# Patient Record
Sex: Male | Born: 1965 | Race: Black or African American | Hispanic: No | Marital: Married | Smoking: Never smoker
Health system: Southern US, Community
[De-identification: ages and names within clinical notes are randomized; demographics above are authoritative.]

## PROBLEM LIST (undated history)

## (undated) DIAGNOSIS — E039 Hypothyroidism, unspecified: Secondary | ICD-10-CM

## (undated) DIAGNOSIS — K259 Gastric ulcer, unspecified as acute or chronic, without hemorrhage or perforation: Secondary | ICD-10-CM

## (undated) HISTORY — PX: SHOULDER SURGERY: SHX246

## (undated) HISTORY — PX: KNEE SURGERY: SHX244

## (undated) HISTORY — PX: BACK SURGERY: SHX140

---

## 2008-08-19 ENCOUNTER — Emergency Department (HOSPITAL_COMMUNITY): Admission: EM | Admit: 2008-08-19 | Discharge: 2008-08-20 | Payer: Self-pay | Admitting: Emergency Medicine

## 2008-10-25 ENCOUNTER — Encounter (HOSPITAL_COMMUNITY): Admission: RE | Admit: 2008-10-25 | Discharge: 2008-11-24 | Payer: Self-pay | Admitting: Family Medicine

## 2010-03-25 ENCOUNTER — Emergency Department: Payer: Self-pay | Admitting: Emergency Medicine

## 2010-05-05 ENCOUNTER — Emergency Department (HOSPITAL_COMMUNITY): Admission: EM | Admit: 2010-05-05 | Discharge: 2010-05-05 | Payer: Self-pay | Admitting: Emergency Medicine

## 2011-05-13 ENCOUNTER — Other Ambulatory Visit: Payer: Self-pay

## 2011-05-13 ENCOUNTER — Emergency Department (HOSPITAL_COMMUNITY)
Admission: EM | Admit: 2011-05-13 | Discharge: 2011-05-13 | Disposition: A | Discharge status: Home / Self Care | Payer: Non-veteran care | Attending: Emergency Medicine | Admitting: Emergency Medicine

## 2011-05-13 ENCOUNTER — Emergency Department (HOSPITAL_COMMUNITY): Payer: Non-veteran care

## 2011-05-13 ENCOUNTER — Encounter: Payer: Self-pay | Admitting: Emergency Medicine

## 2011-05-13 DIAGNOSIS — R079 Chest pain, unspecified: Secondary | ICD-10-CM | POA: Insufficient documentation

## 2011-05-13 DIAGNOSIS — M7120 Synovial cyst of popliteal space [Baker], unspecified knee: Secondary | ICD-10-CM | POA: Insufficient documentation

## 2011-05-13 DIAGNOSIS — Z7982 Long term (current) use of aspirin: Secondary | ICD-10-CM | POA: Insufficient documentation

## 2011-05-13 HISTORY — DX: Hypothyroidism, unspecified: E03.9

## 2011-05-13 LAB — BASIC METABOLIC PANEL
CO2: 20 mEq/L (ref 19–32)
Calcium: 10.5 mg/dL (ref 8.4–10.5)
Chloride: 97 mEq/L (ref 96–112)
GFR calc Af Amer: >60 mL/min (ref >60)
GFR calc non Af Amer: >60 mL/min (ref >60)
Glucose, Bld: 116 mg/dL — ABNORMAL HIGH (ref 70–99)
Potassium: 3.9 mEq/L (ref 3.5–5.1)

## 2011-05-13 LAB — CBC
HCT: 34.1 % — ABNORMAL LOW (ref 39.0–52.0)
MCH: 27.5 pg (ref 26.0–34.0)
MCV: 86.1 fL (ref 78.0–100.0)
Platelets: 523 10*3/uL — ABNORMAL HIGH (ref 150–400)
WBC: 7.3 10*3/uL (ref 4.0–10.5)

## 2011-05-13 LAB — POCT I-STAT TROPONIN I: Troponin i, poc: 0 ng/mL (ref 0.00–0.08)

## 2011-05-13 MED ORDER — HYDROMORPHONE HCL 1 MG/ML IJ SOLN
0.5000 mg | Freq: Once | INTRAMUSCULAR | Status: AC
  Administered 2011-05-13: 1 mg via INTRAVENOUS
  Filled 2011-05-13: qty 1

## 2011-05-13 MED ORDER — DIAZEPAM 5 MG PO TABS: 5.0000 mg | ORAL_TABLET | Freq: Two times a day (BID) | ORAL | Status: AC

## 2011-05-13 MED ORDER — HYDROMORPHONE HCL 1 MG/ML IJ SOLN
1.0000 mg | Freq: Once | INTRAMUSCULAR | Status: AC
  Administered 2011-05-13: 1 mg via INTRAVENOUS
  Filled 2011-05-13: qty 1

## 2011-05-13 MED ORDER — ONDANSETRON HCL 4 MG/2ML IJ SOLN
4.0000 mg | Freq: Once | INTRAMUSCULAR | Status: AC
  Administered 2011-05-13: 4 mg via INTRAVENOUS
  Filled 2011-05-13: qty 2

## 2011-05-13 MED ORDER — HYDROCODONE-ACETAMINOPHEN 5-325 MG PO TABS: 1.0000 | ORAL_TABLET | ORAL | Status: AC | PRN

## 2011-05-13 NOTE — ED Provider Notes (Signed)
History   Chart scribed for Joya Gaskins, MD by Enos Fling; the patient was seen in room APA18/APA18; this patient's care was started at 9:20 AM.    CSN: [id number] Arrival date & time: 05/13/2011  9:20 AM  Chief Complaint  Patient presents with  . Chest Pain  . Shortness of Breath   HPI Louis Ramos is a 45 y.o. male who presents to the Emergency Department complaining of chest pain. Pt reports sharp, constant left sided chest pain was sudden in onset while at rest approx 10 hours ago. Pain is associated with sob and diaphoresis, is non-radiating, and worse with movement and with deep breathing. Reports 1 episode of vomiting en route to ED. Denies fever, chills, cough, congestion, dizziness, weakness, or ha. Pt reports similar pain in the past caused by already being in pain, thinks this cp caused by his left knee pain. Pt is 3 weeks s/p knee surgery and has been sitting a lot d/t this. No recent travel.    PAST MEDICAL HISTORY:  Past Medical History  Diagnosis Date  . Hypothyroid     PAST SURGICAL HISTORY:  Past Surgical History  Procedure Date  . Knee surgery   . Shoulder surgery     MEDICATIONS:  Previous Medications   ACETAMINOPHEN (TYLENOL) 325 MG TABLET    Take 650 mg by mouth every 6 (six) hours as needed. Pain    ASPIRIN 325 MG TABLET    Take 325 mg by mouth daily.     HYDROCODONE-ACETAMINOPHEN (NORCO) 10-325 MG PER TABLET    Take 1 tablet by mouth every 4 (four) hours as needed. Pain    METHIMAZOLE (TAPAZOLE) 10 MG TABLET    Take 20 mg by mouth daily.     METHOCARBAMOL (ROBAXIN) 750 MG TABLET    Take 750 mg by mouth daily.     MULTIPLE VITAMINS-MINERALS (MULTIVITAMIN WITH MINERALS) TABLET    Take 1 tablet by mouth daily.     OXYCODONE (OXY-IR) 5 MG CAPSULE    Take 5-10 mg by mouth every 4 (four) hours as needed. Knee Pain    SENNA (SENOKOT) 8.6 MG TABLET    Take 1 tablet by mouth daily as needed. Constipation      ALLERGIES:  Allergies as of 05/13/2011    . (No Known Allergies)     FAMILY HISTORY:  History reviewed. No pertinent family history.   SOCIAL HISTORY: History   Social History  . Marital Status: Married    Spouse Name: N/A    Number of Children: N/A  . Years of Education: N/A   Social History Main Topics  . Smoking status: Never Smoker   . Smokeless tobacco: None  . Alcohol Use: Yes  . Drug Use: No  . Sexually Active:    Other Topics Concern  . None   Social History Narrative  . None       Review of Systems 10 Systems reviewed and are negative for acute change except as noted in the HPI.  Physical Exam  BP 129/79  Pulse 84  Temp(Src) 98.7 F (37.1 C) (Oral)  Resp 20  Ht 6' (1.829 m)  Wt 244 lb (110.678 kg)  BMI 33.09 kg/m2  SpO2 99%  Physical Exam CONSTITUTIONAL: Well developed/well nourished HEAD AND FACE: Normocephalic/atraumatic EYES: EOMI/PERRL ENMT: Mucous membranes moist NECK: supple no meningeal signs SPINE:entire spine nontender CV: S1/S2 noted, no murmurs/rubs/gallops noted; CHEST: nontender LUNGS: Lungs are clear to auscultation bilaterally, no apparent distress ABDOMEN: soft, nontender,  no rebound or guarding GU:no cva tenderness NEURO: Pt is awake/alert, moves all extremitiesx4 EXTREMITIES: pulses normal; well healing scar to left anterior knee; pain with ROM left knee; no calf edema, tenderness, or erythema SKIN: warm, color normal PSYCH: no abnormalities of mood noted  ED Course  Procedures  OTHER DATA REVIEWED: Nursing notes, vital signs, and past medical records reviewed.   DIAGNOSTIC STUDIES:   Date: 05/13/2011  Rate: 92  Rhythm: normal sinus rhythm  QRS Axis: normal  Intervals: PR prolonged  ST/T Wave abnormalities: nonspecific ST changes  Conduction Disutrbances:none  Narrative Interpretation:   Old EKG Reviewed: none available   LABS / RADIOLOGY:  All labs/vitals reviewed and considered xrays reviewed and considered Initial cxr unremarkable, no acute  findings by EKG    ED COURSE / COORDINATION OF CARE: 12:01 PM Pt improved.  I had long d/w patient and his wife.  Apparently he has been having persistent pain in left knee/thigh/calf since surgery. He will have "spasms" of pain in the limb, and will cause intense pain and cause him to have cold sweats.  He reports last night, he was having spasms of pain in his left LE and he felt very anxious and felt left sided CP that was sharp and worse with palpation/movement of left Ue.  My suspicion for ACS is low (troponin ordered by nurse) and given history/exam, I doubt a PE.  However, he does have persistent pain in left thigh and some in left calf on repeat eval.  Will order DVT study to r/o DVT.  Pt reports no known h/o DVT and no eval for DVT.  His wound from recent surgery looks well healed without signs of infection   D/w patient about pain control.  He sees no relief from spasms from methacarbamol.  Advise to hold this and will start short course of valium for spasms He also is requesting hydrocodone as most of this was used after his surgery.  I will write short course of vicodin and [facility]  IMPRESSION Baker's cyst  Chest pain, unspecified      PLAN:  discharge The patient is to return the emergency department if there is any worsening of symptoms. I have reviewed the discharge instructions with the patient   CONDITION ON DISCHARGE: stable   MEDICATIONS GIVEN IN THE E.D. HYDROmorphone (DILAUDID) injection 1 mg (1 mg Intravenous Given 05/13/11 0939)  ondansetron (ZOFRAN) injection 4 mg (4 mg Intravenous Given 05/13/11 0938)  HYDROmorphone (DILAUDID) injection 0.5 mg (1 mg Intravenous Given 05/13/11 1035)     DISCHARGE MEDICATIONS: New Prescriptions   DIAZEPAM (VALIUM) 5 MG TABLET    Take 1 tablet (5 mg total) by mouth 2 (two) times daily.   HYDROCODONE-ACETAMINOPHEN (NORCO) 5-325 MG PER TABLET    Take 1 tablet by mouth every 4 (four) hours as needed for  pain (do not take with tylenol).     I personally performed the services described in this documentation, which was scribed in my presence. The recorded information has been reviewed and considered. Joya Gaskins, MD      Joya Gaskins, MD 05/13/11 [phone]

## 2011-05-13 NOTE — ED Notes (Signed)
Chest pain with sob since last night. N/v this am.

## 2011-05-13 NOTE — ED Notes (Signed)
Has no old EKG

## 2011-05-13 NOTE — ED Notes (Signed)
Left in c/o spouse for transport home; a&ox4; in no distress; VSS; verbalizes understanding of instructions given

## 2011-07-08 LAB — CLOSTRIDIUM DIFFICILE EIA

## 2011-07-08 LAB — FECAL LACTOFERRIN, QUANT: Fecal Lactoferrin: POSITIVE

## 2011-07-08 LAB — BASIC METABOLIC PANEL
BUN: 19
Creatinine, Ser: 0.72
GFR calc non Af Amer: >60

## 2011-07-08 LAB — OVA AND PARASITE EXAMINATION: Ova and parasites: NONE SEEN

## 2011-07-08 LAB — STOOL CULTURE

## 2011-07-08 LAB — DIFFERENTIAL
Basophils Absolute: 0
Eosinophils Absolute: 0.3
Lymphocytes Relative: 37
Lymphs Abs: 2.9
Neutrophils Relative %: 47

## 2011-07-08 LAB — CBC
Platelets: 222
RDW: 12.5
WBC: 7.8

## 2012-06-04 ENCOUNTER — Emergency Department (HOSPITAL_COMMUNITY)
Admission: EM | Admit: 2012-06-04 | Discharge: 2012-06-05 | Disposition: A | Discharge status: Home / Self Care | Payer: Non-veteran care | Attending: Emergency Medicine | Admitting: Emergency Medicine

## 2012-06-04 ENCOUNTER — Encounter (HOSPITAL_COMMUNITY): Payer: Self-pay | Admitting: Emergency Medicine

## 2012-06-04 DIAGNOSIS — M549 Dorsalgia, unspecified: Secondary | ICD-10-CM | POA: Insufficient documentation

## 2012-06-04 DIAGNOSIS — E039 Hypothyroidism, unspecified: Secondary | ICD-10-CM | POA: Insufficient documentation

## 2012-06-04 DIAGNOSIS — M1710 Unilateral primary osteoarthritis, unspecified knee: Secondary | ICD-10-CM | POA: Insufficient documentation

## 2012-06-04 DIAGNOSIS — M1711 Unilateral primary osteoarthritis, right knee: Secondary | ICD-10-CM

## 2012-06-04 HISTORY — DX: Gastric ulcer, unspecified as acute or chronic, without hemorrhage or perforation: K25.9

## 2012-06-04 MED ORDER — OXYCODONE-ACETAMINOPHEN 5-325 MG PO TABS
2.0000 | ORAL_TABLET | Freq: Once | ORAL | Status: AC
  Administered 2012-06-05: 2 via ORAL
  Filled 2012-06-04: qty 2

## 2012-06-04 NOTE — ED Notes (Signed)
Patient complaining of lower back pain and bilateral knee pain for "years." States "we took our son shopping last week and I guess it aggravated it."

## 2012-06-04 NOTE — ED Provider Notes (Signed)
History     CSN: [id number]  Arrival date & time 06/04/12  2252   First MD Initiated Contact with Patient 06/04/12 2344      Chief Complaint  Patient presents with  . Knee Pain  . 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 He states that he has had pain in his left knee ever since the surgery, has been seen by his orthopedist at the [facility] and told that there was nothing they could do about it. It has not worsened but is persistent. His right knee has been hurting over the last week since he was walking more when he was shopping with his family. The right knee hurts on the medial surface, is worse with standing, relieved with sitting. There is no significant swelling, fever, rash but he does complain of frequent urination. He denies any IV drug use, cancer and he states that the pain in his lower back has been on for years though it has been worse lately. It is totally relieved when he lays down and worse when he stands up. He does not have any numbness weakness or difficulty using his legs other than the pain in his knees. He has used a variety of medications including methadone, Vicodin and Tylenol and aspirin, minimal improvement.  Patient is a 46 y.o. male presenting with knee pain and back pain. The history is provided by the patient and the spouse.  Knee Pain  Back Pain     Past Medical History  Diagnosis Date  . Hypothyroid   . Stomach ulcer     Past Surgical History  Procedure Date  . Knee surgery   . Shoulder surgery     History reviewed. No pertinent family history.  History  Substance Use Topics  . Smoking status: Never Smoker   . Smokeless tobacco: Not on file  . Alcohol Use: No      Review of Systems  Musculoskeletal: Positive for  back pain.  All other systems reviewed and are negative.    Allergies  Review of patient's allergies indicates no known allergies.  Home Medications   Current Outpatient Rx  Name Route Sig Dispense Refill  . ACETAMINOPHEN 325 MG PO TABS Oral Take 650 mg by mouth every 6 (six) hours as needed. Pain     . ASPIRIN 325 MG PO TABS Oral Take 325 mg by mouth daily.      Marland Kitchen HYDROCODONE-ACETAMINOPHEN 10-325 MG PO TABS Oral Take 1 tablet by mouth every 4 (four) hours as needed. Pain     . METHIMAZOLE 10 MG PO TABS Oral Take 20 mg by mouth daily.      Marland Kitchen METHOCARBAMOL 750 MG PO TABS Oral Take 750 mg by mouth daily.      . MULTI-VITAMIN/MINERALS PO TABS Oral Take 1 tablet by mouth daily.      . OXYCODONE HCL 5 MG PO CAPS Oral Take 5-10 mg by mouth every 4 (four) hours as needed. Knee Pain     . OXYCODONE-ACETAMINOPHEN 5-325 MG PO TABS Oral Take 1 tablet by mouth every 4 (four) hours as needed for pain. 20 tablet 0  . SENNOSIDES 8.6 MG PO TABS Oral Take 1 tablet by mouth daily as needed. Constipation       BP 130/94  Pulse 76  Temp 97.9 F (36.6 C) (Oral)  Resp 18  Ht 6' (1.829 m)  Wt 290 lb (131.543 kg)  BMI 39.33 kg/m2  SpO2 95%  Physical Exam  Nursing note and vitals reviewed. Constitutional: He appears well-developed and well-nourished. No distress.  HENT:  Head: Normocephalic and atraumatic.  Mouth/Throat: Oropharynx is clear and moist. No oropharyngeal exudate.  Eyes: Conjunctivae and EOM are normal. Pupils are equal, round, and reactive to light. Right eye exhibits no discharge. Left eye exhibits no discharge. No scleral icterus.  Neck: Normal range of motion. Neck supple. No JVD present. No thyromegaly present.  Cardiovascular: Normal rate, regular rhythm, normal heart sounds and intact distal pulses.  Exam reveals no gallop and no friction rub.   No murmur heard. Pulmonary/Chest: Effort normal and breath sounds normal. No respiratory distress. He has no wheezes. He has no rales.    Abdominal: Soft. Bowel sounds are normal. He exhibits no distension and no mass. There is no tenderness.  Musculoskeletal: Normal range of motion. He exhibits tenderness ( Focal tenderness to palpation over the lower lumbar spine and paraspinal muscles, no rashes or injuries to the skin). He exhibits no edema.       Tenderness over the bilateral knees, the left knee is tender at the left lateral lower knee, the right is tender at the right medial lower knee. There is decreased range of motion bilaterally secondary to this pain.  Lymphadenopathy:    He has no cervical adenopathy.  Neurological: He is alert. Coordination normal.       Antalgic gait secondary to pain in the knees and back, when isolated normal strength of the quadriceps, hamstrings, calf muscles, upper tremor is appear normal. Sensation intact to the lower extremities bilaterally  Skin: Skin is warm and dry. No rash noted. No erythema.  Psychiatric: He has a normal mood and affect. His behavior is normal.    ED Course  Procedures (including critical care time)  Labs Reviewed  URINALYSIS, ROUTINE W REFLEX MICROSCOPIC - Abnormal; Notable for the following:    Specific Gravity, Urine >1.030 (*)     Hgb urine dipstick TRACE (*)     All other components within normal limits  URINE MICROSCOPIC-ADD ON - Abnormal; Notable for the following:    Squamous Epithelial / LPF FEW (*)     All other components within normal limits  GLUCOSE, CAPILLARY   Dg Knee Complete 4 Views Left  06/05/2012  *RADIOLOGY REPORT*  Clinical Data: Knee pain with history of left knee arthroplasty.  LEFT KNEE - COMPLETE 4+ VIEW  Comparison: None.  Findings: Left total knee arthroplasty shows normal alignment.  No evidence of fracture or dislocation.  No joint effusion identified. No abnormal lucency surrounding the knee prosthesis.  IMPRESSION: Normal appearance of left knee following prior arthroplasty.   Original Report Authenticated By: Reola Calkins, M.D.     Dg Knee Complete 4 Views Right  06/05/2012  *RADIOLOGY REPORT*  Clinical Data: Right knee pain.  RIGHT KNEE - COMPLETE 4+ VIEW  Comparison: None.  Findings: There is evidence of osteoarthritis with tricompartmental degenerative changes present.  The most significant joint space narrowing involves the medial compartment.  No evidence of fracture, dislocation or bony lesion.  No significant joint fluid identified.  IMPRESSION: Tricompartmental osteoarthritis.   Original Report Authenticated By: Reola Calkins, M.D.      1. Osteoarthritis of right knee   2. Back pain       MDM  Imaging of the lower back will yield minimal results, the patient is insistent  on having his knees x-rayed, vital signs are unremarkable, there is no neurologic symptoms or pathologic signs of back pain. Check CBG and urinalysis to rule out hyperglycemia or urinary infection as source of the patient's urinary frequency. Percocet ordered   I have personally interpreted the x-rays and find her to be osteoarthritis of the right knee present. There are no signs of fractures of the right knee, the internal hardware in the left knee appears normal. The patient has had improvement with Percocet, his urinalysis has been reviewed showing no signs of infection and his blood sugar is normal. Reassurance given, orthopedic followup encouraged, with Percocet. The patient does have a significant history of stomach ulcers after taking anti-inflammatories in the past.     Vida Roller, MD 06/05/12 [phone]

## 2012-06-05 ENCOUNTER — Emergency Department (HOSPITAL_COMMUNITY): Payer: Non-veteran care

## 2012-06-05 LAB — URINALYSIS, ROUTINE W REFLEX MICROSCOPIC
Glucose, UA: NEGATIVE mg/dL
Leukocytes, UA: NEGATIVE
Nitrite: NEGATIVE
Protein, ur: NEGATIVE mg/dL

## 2012-06-05 LAB — URINE MICROSCOPIC-ADD ON

## 2012-06-05 LAB — GLUCOSE, CAPILLARY: Glucose-Capillary: 99 mg/dL (ref 70–99)

## 2012-06-05 MED ORDER — OXYCODONE-ACETAMINOPHEN 5-325 MG PO TABS: 1.0000 | ORAL_TABLET | ORAL | Status: AC | PRN

## 2013-01-01 IMAGING — CR DG KNEE COMPLETE 4+V*L*
4 series · 4 of 4 positions shown · non-contrast
Comparison: None.

CLINICAL DATA: Knee pain with history of left knee arthroplasty.

LEFT KNEE - COMPLETE 4+ VIEW

[view not recorded (1 of 4)]
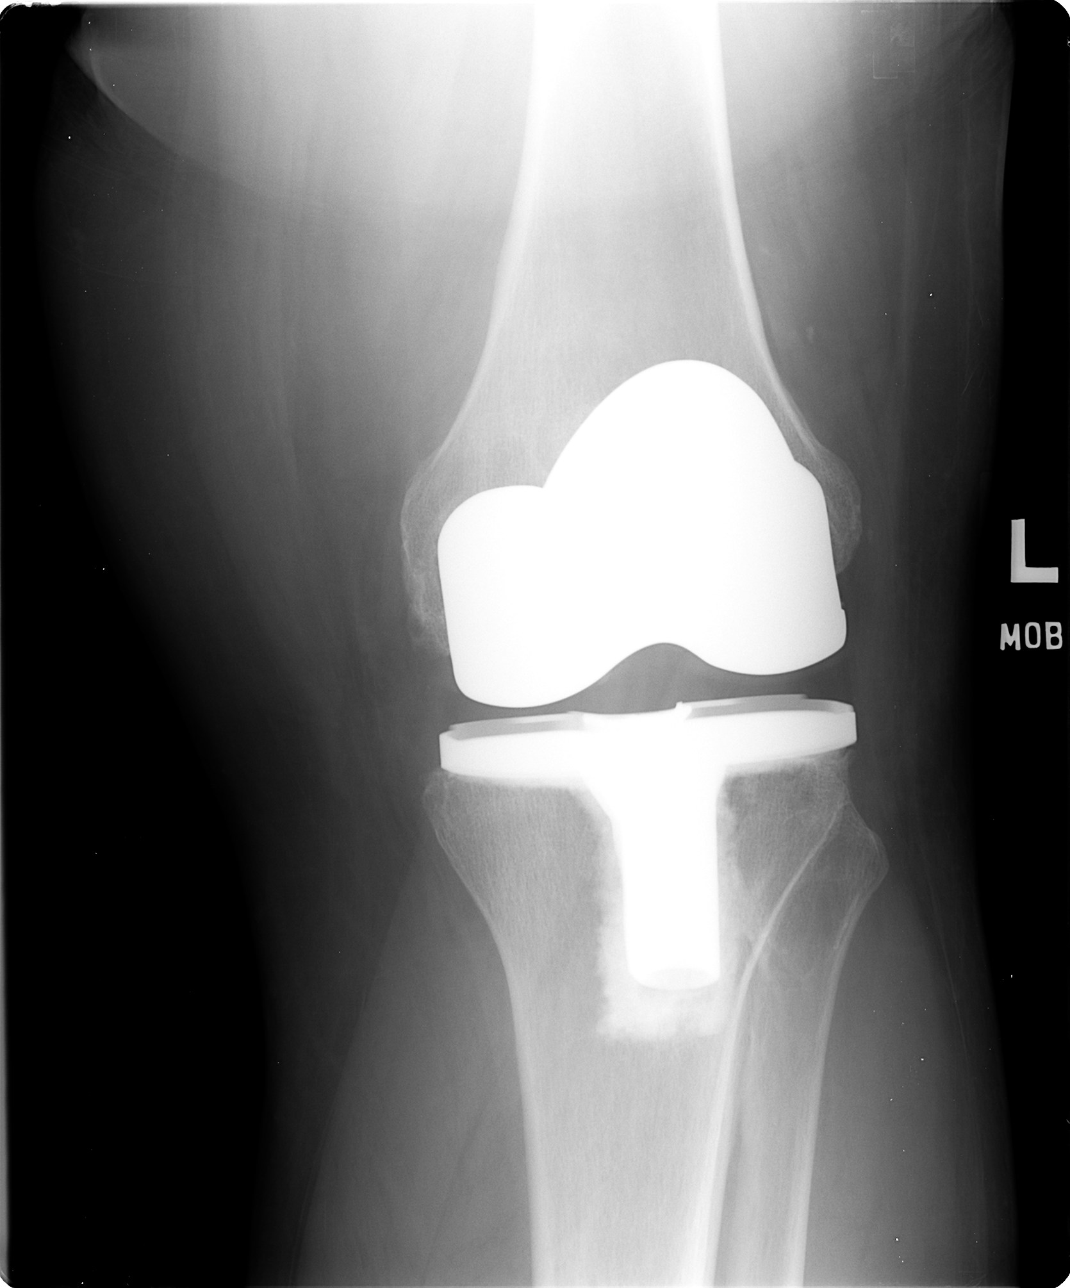

[view not recorded (2 of 4)]
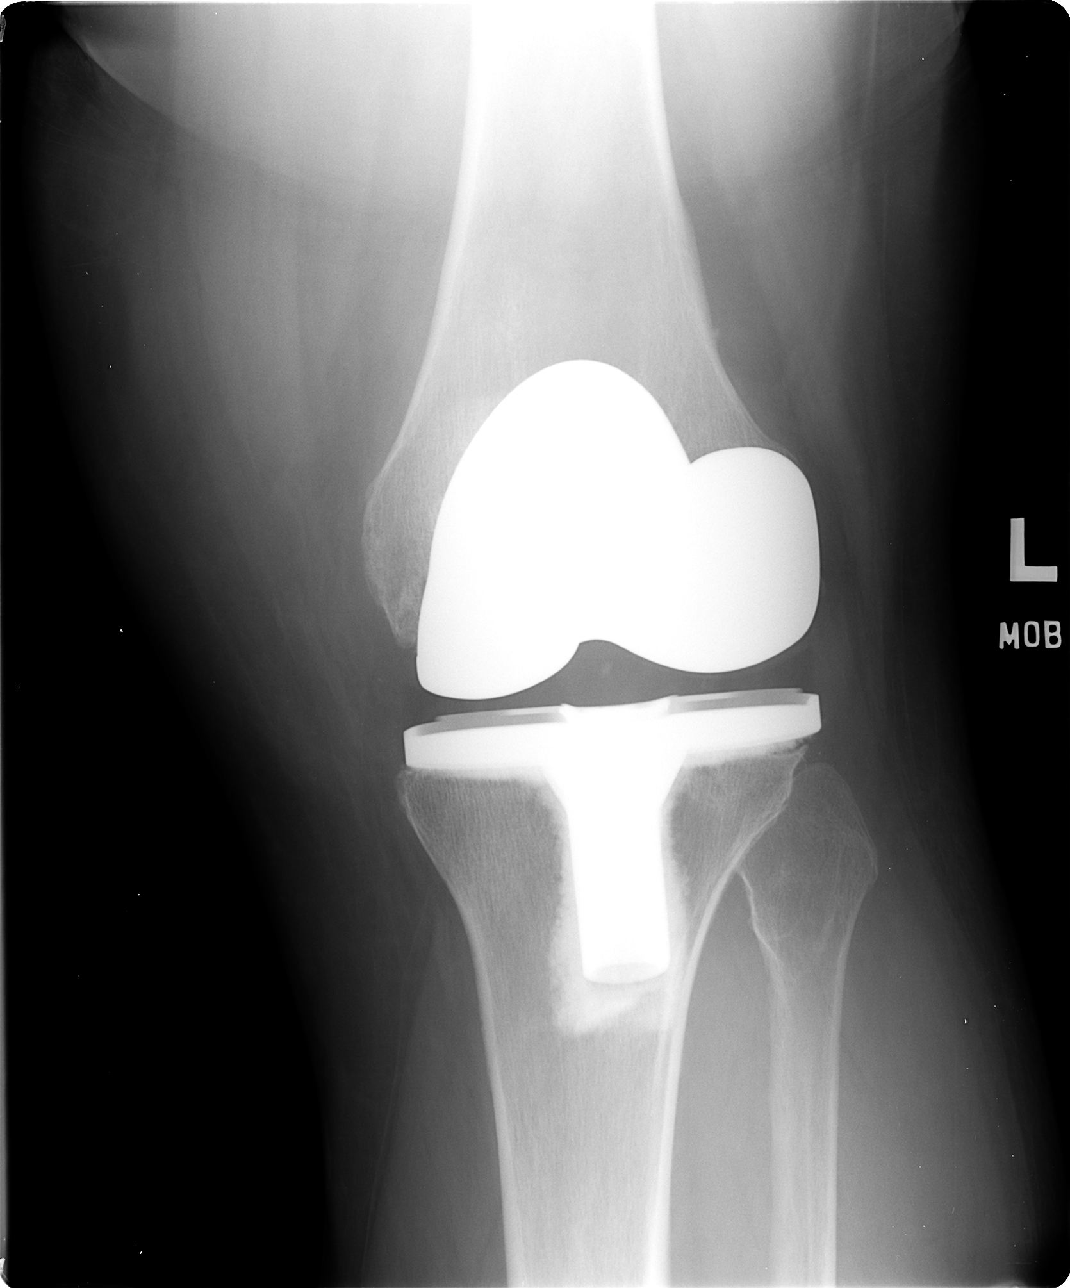

[view not recorded (3 of 4)]
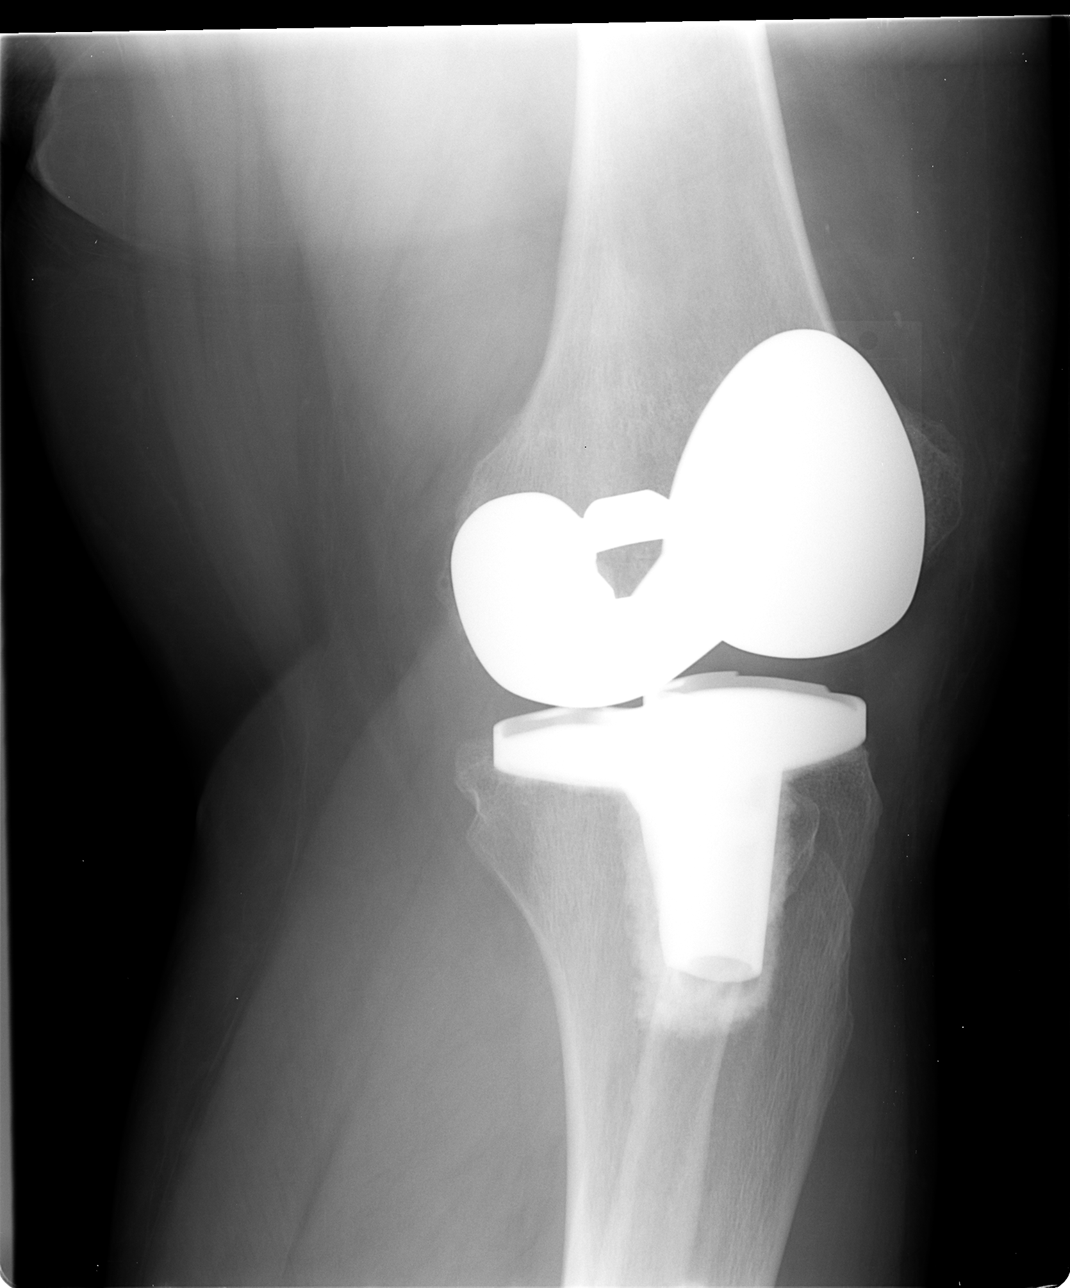

[view not recorded (4 of 4)]
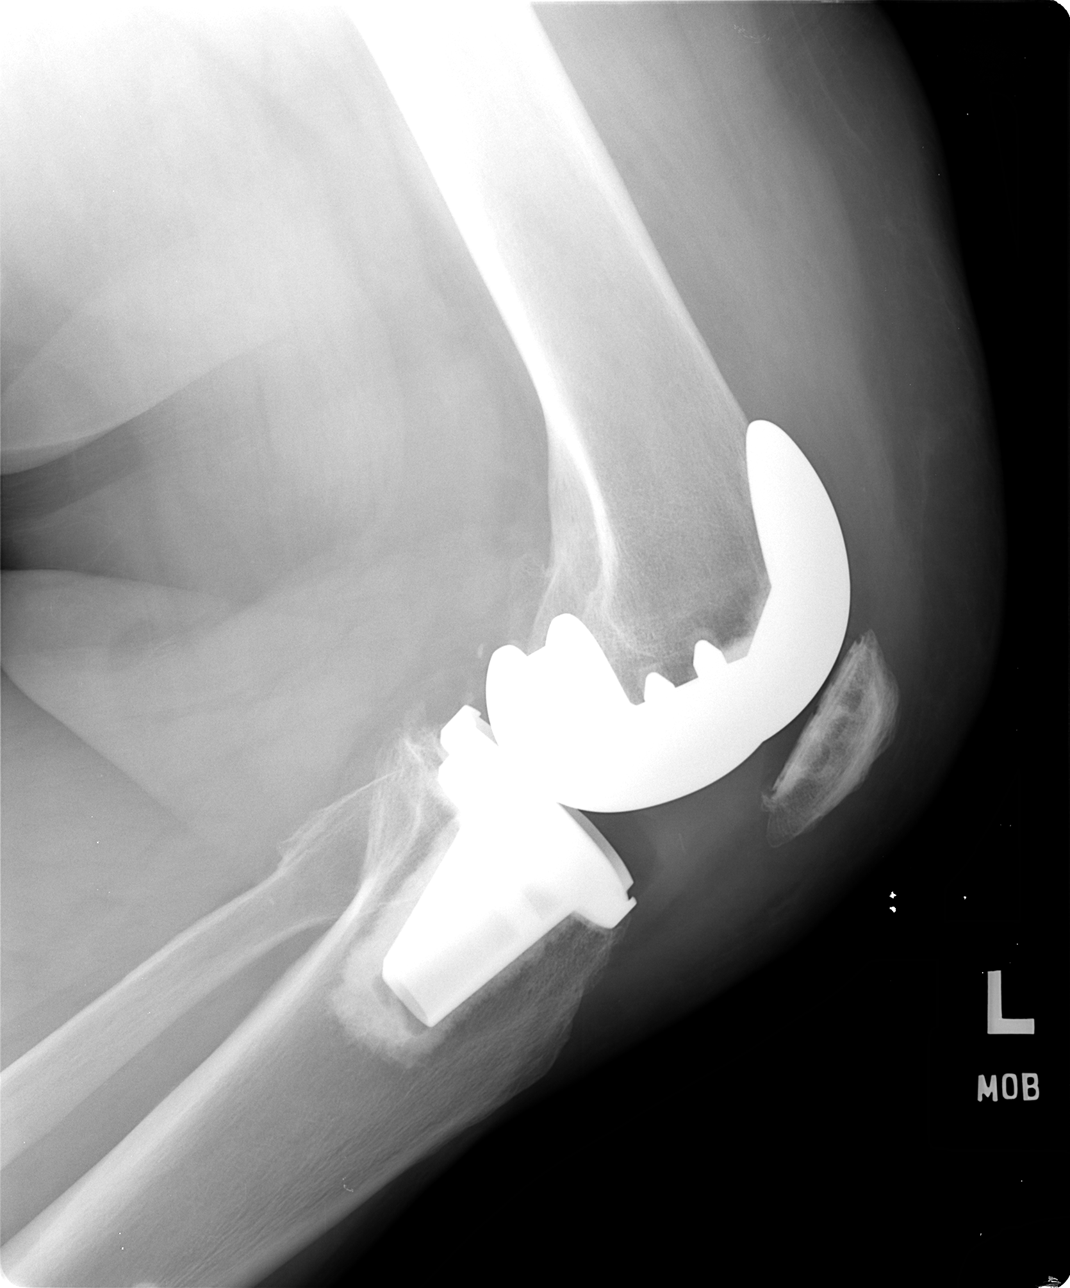

[4 of 4 positions shown; findings below may reference images not displayed]

FINDINGS: Left total knee arthroplasty shows normal alignment.  No
evidence of fracture or dislocation.  No joint effusion identified.
No abnormal lucency surrounding the knee prosthesis.
IMPRESSION: Normal appearance of left knee following prior arthroplasty.

## 2021-05-15 ENCOUNTER — Other Ambulatory Visit: Payer: Self-pay

## 2021-05-15 ENCOUNTER — Ambulatory Visit (INDEPENDENT_AMBULATORY_CARE_PROVIDER_SITE_OTHER): Payer: No Typology Code available for payment source | Admitting: Podiatry

## 2021-05-15 VITALS — BP 150/90 | HR 94 | Temp 96.2°F | Resp 24

## 2021-05-15 DIAGNOSIS — L853 Xerosis cutis: Secondary | ICD-10-CM

## 2021-05-15 DIAGNOSIS — B353 Tinea pedis: Secondary | ICD-10-CM | POA: Diagnosis not present

## 2021-05-15 MED ORDER — AMMONIUM LACTATE 12 % EX LOTN: 1.0000 "application " | TOPICAL_LOTION | CUTANEOUS | 0 refills | Status: AC | PRN

## 2021-05-15 MED ORDER — CLOTRIMAZOLE-BETAMETHASONE 1-0.05 % EX CREA: 1.0000 "application " | TOPICAL_CREAM | Freq: Two times a day (BID) | CUTANEOUS | 0 refills | Status: AC

## 2021-05-20 NOTE — Progress Notes (Signed)
Subjective:  Patient ID: Louis Ramos, male    DOB: 01-30-1966,  MRN: [id number]  Chief Complaint  Patient presents with   Nail Problem    "My toenails are ugly and my skin won't stop peeling.  Sometimes my feet itch."    55 y.o. male presents with the above complaint.  Patient presents with complaint of bilateral plantar athlete's foot with subjective component of itching.  Patient states she is tried over-the-counter medication to help with this but none of which has helped.  There might be component of dry skin as well.  She states been going for quite some time is progressive gotten worse.  She would like to discuss treatment options for it.  She does not want take any oral medication.  She has not seen and was prior to see me.  No pain associated with it.   Review of Systems: Negative except as noted in the HPI. Denies N/V/F/Ch.  Past Medical History:  Diagnosis Date   Hypothyroid    Stomach ulcer     Current Outpatient Medications:    ammonium lactate (AMLACTIN) 12 % lotion, Apply 1 application topically as needed for dry skin., Disp: 400 g, Rfl: 0   clotrimazole-betamethasone (LOTRISONE) cream, Apply 1 application topically 2 (two) times daily., Disp: 30 g, Rfl: 0   acetaminophen (TYLENOL) 325 MG tablet, Take 650 mg by mouth every 6 (six) hours as needed. Pain , Disp: , Rfl:    aspirin 325 MG tablet, Take 325 mg by mouth daily.  , Disp: , Rfl:    HYDROcodone-acetaminophen (NORCO) 10-325 MG per tablet, Take 1 tablet by mouth every 4 (four) hours as needed. Pain  (Patient not taking: Reported on 05/15/2021), Disp: , Rfl:    methimazole (TAPAZOLE) 10 MG tablet, Take 20 mg by mouth daily.   (Patient not taking: Reported on 05/15/2021), Disp: , Rfl:    methocarbamol (ROBAXIN) 750 MG tablet, Take 750 mg by mouth daily.   (Patient not taking: Reported on 05/15/2021), Disp: , Rfl:    Multiple Vitamins-Minerals (MULTIVITAMIN WITH MINERALS) tablet, Take 1 tablet by mouth daily.  , Disp: ,  Rfl:    oxycodone (OXY-IR) 5 MG capsule, Take 5-10 mg by mouth every 4 (four) hours as needed. Knee Pain  (Patient not taking: Reported on 05/15/2021), Disp: , Rfl:    senna (SENOKOT) 8.6 MG tablet, Take 1 tablet by mouth daily as needed. Constipation  (Patient not taking: Reported on 05/15/2021), Disp: , Rfl:   Social History   Tobacco Use  Smoking Status Never  Smokeless Tobacco Not on file    No Known Allergies Objective:   Vitals:   05/15/21 1526  BP: (!) 150/90  Pulse: 94  Resp: (!) 24  Temp: (!) 96.2 F (35.7 C)   There is no height or weight on file to calculate BMI. Constitutional Well developed. Well nourished.  Vascular Dorsalis pedis pulses palpable bilaterally. Posterior tibial pulses palpable bilaterally. Capillary refill normal to all digits.  No cyanosis or clubbing noted. Pedal hair growth normal.  Neurologic Normal speech. Oriented to person, place, and time. Epicritic sensation to light touch grossly present bilaterally.  Dermatologic Epidermal lysis of the both plantar skin noted with subjective component of itching.  Dryness noted to the dorsal aspect of the foot.  No pain on palpation.  Orthopedic: Normal joint ROM without pain or crepitus bilaterally. No visible deformities. No bony tenderness.   Radiographs: None Assessment:   1. Tinea pedis of both feet  2. Xerosis of skin    Plan:  Patient was evaluated and treated and all questions answered.  Bilateral athlete's foot with composition of xerosis -I explained to the patient the etiology of xerosis and various treatment options were extensively discussed.  I explained to the patient the importance of maintaining moisturization of the skin with application of over-the-counter lotion such as Eucerin or Luciderm.  Given that he has failed over-the-counter medication benefit ammonium lactate.  Ammonium lactate was sent to the pharmacy.  Asked to apply twice a day.  He states understanding -Given that  there is a component of athlete's foot associated with it.  I believe patient will benefit from Lotrisone cream.  Lotrisone cream was sent to the pharmacy.   No follow-ups on file.

## 2021-07-14 ENCOUNTER — Other Ambulatory Visit: Payer: Self-pay | Admitting: Podiatry

## 2021-08-12 ENCOUNTER — Ambulatory Visit: Payer: Non-veteran care | Admitting: Podiatry

## 2021-08-21 ENCOUNTER — Ambulatory Visit: Payer: Non-veteran care | Admitting: Podiatry

## 2021-08-21 ENCOUNTER — Telehealth: Payer: Self-pay | Admitting: Podiatry

## 2021-08-21 NOTE — Telephone Encounter (Signed)
Patient came in and wanted to see if you could send in a RX for his Loritisone cream. Patient uses [facility]

## 2021-08-22 MED ORDER — CLOTRIMAZOLE-BETAMETHASONE 1-0.05 % EX CREA: 1.0000 "application " | TOPICAL_CREAM | Freq: Two times a day (BID) | CUTANEOUS | 0 refills | Status: DC

## 2021-08-22 NOTE — Telephone Encounter (Signed)
Called pt let him know RX was sent to pharmacy.

## 2021-11-25 ENCOUNTER — Ambulatory Visit (INDEPENDENT_AMBULATORY_CARE_PROVIDER_SITE_OTHER): Payer: No Typology Code available for payment source | Admitting: Podiatry

## 2021-11-25 ENCOUNTER — Other Ambulatory Visit: Payer: Self-pay

## 2021-11-25 DIAGNOSIS — L600 Ingrowing nail: Secondary | ICD-10-CM

## 2021-11-27 ENCOUNTER — Encounter: Payer: Self-pay | Admitting: Podiatry

## 2021-11-27 ENCOUNTER — Other Ambulatory Visit: Payer: Self-pay | Admitting: Podiatry

## 2021-11-27 MED ORDER — ACETAMINOPHEN-CODEINE #3 300-30 MG PO TABS: 1.0000 | ORAL_TABLET | ORAL | 0 refills | Status: DC | PRN

## 2021-11-27 NOTE — Progress Notes (Signed)
Subjective:  Patient ID: Louis Ramos, male    DOB: [birth year],  MRN: [id number]  Chief Complaint  Patient presents with   Nail Problem    Nail trim     56 y.o. male presents with the above complaint.  Patient presents with bilateral hallux thickened elongated dystrophic toenails x2.  Patient states painful to touch.  He would like to have removed.  He states that ingrown part of the nail was in the thickened part of the nail was really hurting him especially with ambulation.  He has tried all the other treatment options including conservative managing it debriding down none of which has helped.  It is notably permanent.   Review of Systems: Negative except as noted in the HPI. Denies N/V/F/Ch.  Past Medical History:  Diagnosis Date   Hypothyroid    Stomach ulcer     Current Outpatient Medications:    acetaminophen (TYLENOL) 325 MG tablet, Take 650 mg by mouth every 6 (six) hours as needed. Pain , Disp: , Rfl:    acetaminophen-codeine (TYLENOL #3) 300-30 MG tablet, Take 1-2 tablets by mouth every 4 (four) hours as needed for moderate pain., Disp: 30 tablet, Rfl: 0   ammonium lactate (AMLACTIN) 12 % lotion, Apply 1 application topically as needed for dry skin., Disp: 400 g, Rfl: 0   aspirin 325 MG tablet, Take 325 mg by mouth daily.  , Disp: , Rfl:    clotrimazole-betamethasone (LOTRISONE) cream, Apply 1 application topically 2 (two) times daily., Disp: 30 g, Rfl: 0   clotrimazole-betamethasone (LOTRISONE) cream, Apply 1 application topically 2 (two) times daily., Disp: 30 g, Rfl: 0   HYDROcodone-acetaminophen (NORCO) 10-325 MG per tablet, Take 1 tablet by mouth every 4 (four) hours as needed. Pain  (Patient not taking: Reported on 05/15/2021), Disp: , Rfl:    methimazole (TAPAZOLE) 10 MG tablet, Take 20 mg by mouth daily.   (Patient not taking: Reported on 05/15/2021), Disp: , Rfl:    methocarbamol (ROBAXIN) 750 MG tablet, Take 750 mg by mouth daily.   (Patient not taking: Reported on  05/15/2021), Disp: , Rfl:    Multiple Vitamins-Minerals (MULTIVITAMIN WITH MINERALS) tablet, Take 1 tablet by mouth daily.  , Disp: , Rfl:    oxycodone (OXY-IR) 5 MG capsule, Take 5-10 mg by mouth every 4 (four) hours as needed. Knee Pain  (Patient not taking: Reported on 05/15/2021), Disp: , Rfl:    senna (SENOKOT) 8.6 MG tablet, Take 1 tablet by mouth daily as needed. Constipation  (Patient not taking: Reported on 05/15/2021), Disp: , Rfl:   Social History   Tobacco Use  Smoking Status Never  Smokeless Tobacco Not on file    No Known Allergies Objective:  There were no vitals filed for this visit. There is no height or weight on file to calculate BMI. Constitutional Well developed. Well nourished.  Vascular Dorsalis pedis pulses palpable bilaterally. Posterior tibial pulses palpable bilaterally. Capillary refill normal to all digits.  No cyanosis or clubbing noted. Pedal hair growth normal.  Neurologic Normal speech. Oriented to person, place, and time. Epicritic sensation to light touch grossly present bilaterally.  Dermatologic Pain on palpation of the entire/total nail on 1st digit of the bilaterally No other open wounds. No skin lesions.  Orthopedic: Normal joint ROM without pain or crepitus bilaterally. No visible deformities. No bony tenderness.   Radiographs: None Assessment:   1. Ingrown toenail of right foot   2. Ingrown left greater toenail    Plan:  Patient  was evaluated and treated and all questions answered.  Nail contusion/dystrophy hallux with underlying ingrown, bilaterally -Patient elects to proceed with minor surgery to remove entire toenail today. Consent reviewed and signed by patient. -Entire/total nail excised. See procedure note. -Educated on post-procedure care including soaking. Written instructions provided and reviewed. -Patient to follow up in 2 weeks for nail check.  Procedure: Excision of entire/total nail  Location: Bilateral 1st toe  digit Anesthesia: Lidocaine 1% plain; 1.5 mL and Marcaine 0.5% plain; 1.5 mL, digital block. Skin Prep: Betadine. Dressing: Silvadene; telfa; dry, sterile, compression dressing. Technique: Following skin prep, the toe was exsanguinated and a tourniquet was secured at the base of the toe. The affected nail border was freed and excised. The tourniquet was then removed and sterile dressing applied. Disposition: Patient tolerated procedure well. Patient to return in 2 weeks for follow-up.   No follow-ups on file.

## 2022-07-18 DIAGNOSIS — R29818 Other symptoms and signs involving the nervous system: Secondary | ICD-10-CM | POA: Insufficient documentation

## 2022-07-18 DIAGNOSIS — R1314 Dysphagia, pharyngoesophageal phase: Secondary | ICD-10-CM | POA: Insufficient documentation

## 2022-07-18 DIAGNOSIS — B353 Tinea pedis: Secondary | ICD-10-CM | POA: Insufficient documentation

## 2022-07-18 DIAGNOSIS — E039 Hypothyroidism, unspecified: Secondary | ICD-10-CM | POA: Insufficient documentation

## 2022-07-18 DIAGNOSIS — G4733 Obstructive sleep apnea (adult) (pediatric): Secondary | ICD-10-CM | POA: Insufficient documentation

## 2022-07-18 DIAGNOSIS — M545 Low back pain, unspecified: Secondary | ICD-10-CM | POA: Insufficient documentation

## 2022-07-18 DIAGNOSIS — E0500 Thyrotoxicosis with diffuse goiter without thyrotoxic crisis or storm: Secondary | ICD-10-CM | POA: Insufficient documentation

## 2022-07-18 DIAGNOSIS — N529 Male erectile dysfunction, unspecified: Secondary | ICD-10-CM | POA: Insufficient documentation

## 2022-07-18 DIAGNOSIS — Z789 Other specified health status: Secondary | ICD-10-CM | POA: Insufficient documentation

## 2022-07-18 DIAGNOSIS — M7918 Myalgia, other site: Secondary | ICD-10-CM | POA: Insufficient documentation

## 2022-07-18 DIAGNOSIS — M532X7 Spinal instabilities, lumbosacral region: Secondary | ICD-10-CM | POA: Insufficient documentation

## 2022-07-18 DIAGNOSIS — Z23 Encounter for immunization: Secondary | ICD-10-CM | POA: Insufficient documentation

## 2022-07-18 DIAGNOSIS — I10 Essential (primary) hypertension: Secondary | ICD-10-CM | POA: Insufficient documentation

## 2022-07-18 DIAGNOSIS — Z96659 Presence of unspecified artificial knee joint: Secondary | ICD-10-CM | POA: Insufficient documentation

## 2022-07-18 DIAGNOSIS — K760 Fatty (change of) liver, not elsewhere classified: Secondary | ICD-10-CM | POA: Insufficient documentation

## 2022-07-18 DIAGNOSIS — M25569 Pain in unspecified knee: Secondary | ICD-10-CM | POA: Insufficient documentation

## 2022-07-18 DIAGNOSIS — M7540 Impingement syndrome of unspecified shoulder: Secondary | ICD-10-CM | POA: Insufficient documentation

## 2022-07-18 DIAGNOSIS — F32A Depression, unspecified: Secondary | ICD-10-CM | POA: Insufficient documentation

## 2022-07-18 DIAGNOSIS — R945 Abnormal results of liver function studies: Secondary | ICD-10-CM | POA: Insufficient documentation

## 2022-07-18 DIAGNOSIS — Z79899 Other long term (current) drug therapy: Secondary | ICD-10-CM | POA: Insufficient documentation

## 2022-07-18 DIAGNOSIS — M899 Disorder of bone, unspecified: Secondary | ICD-10-CM | POA: Insufficient documentation

## 2022-07-18 DIAGNOSIS — R1310 Dysphagia, unspecified: Secondary | ICD-10-CM | POA: Insufficient documentation

## 2022-07-18 DIAGNOSIS — M48062 Spinal stenosis, lumbar region with neurogenic claudication: Secondary | ICD-10-CM | POA: Insufficient documentation

## 2022-07-18 DIAGNOSIS — R319 Hematuria, unspecified: Secondary | ICD-10-CM | POA: Insufficient documentation

## 2022-07-18 DIAGNOSIS — E785 Hyperlipidemia, unspecified: Secondary | ICD-10-CM | POA: Insufficient documentation

## 2022-07-18 DIAGNOSIS — E8881 Metabolic syndrome: Secondary | ICD-10-CM | POA: Insufficient documentation

## 2022-07-18 DIAGNOSIS — E119 Type 2 diabetes mellitus without complications: Secondary | ICD-10-CM | POA: Insufficient documentation

## 2022-07-18 DIAGNOSIS — M179 Osteoarthritis of knee, unspecified: Secondary | ICD-10-CM | POA: Insufficient documentation

## 2022-07-18 DIAGNOSIS — F339 Major depressive disorder, recurrent, unspecified: Secondary | ICD-10-CM | POA: Insufficient documentation

## 2022-07-18 DIAGNOSIS — G894 Chronic pain syndrome: Secondary | ICD-10-CM | POA: Insufficient documentation

## 2022-07-18 DIAGNOSIS — K635 Polyp of colon: Secondary | ICD-10-CM | POA: Insufficient documentation

## 2022-07-18 DIAGNOSIS — Z9989 Dependence on other enabling machines and devices: Secondary | ICD-10-CM | POA: Insufficient documentation

## 2022-07-18 DIAGNOSIS — G8929 Other chronic pain: Secondary | ICD-10-CM | POA: Insufficient documentation

## 2022-07-18 DIAGNOSIS — Z719 Counseling, unspecified: Secondary | ICD-10-CM | POA: Insufficient documentation

## 2022-07-18 DIAGNOSIS — Z7189 Other specified counseling: Secondary | ICD-10-CM | POA: Insufficient documentation

## 2022-07-18 DIAGNOSIS — M25469 Effusion, unspecified knee: Secondary | ICD-10-CM | POA: Insufficient documentation

## 2022-07-18 DIAGNOSIS — R5381 Other malaise: Secondary | ICD-10-CM | POA: Insufficient documentation

## 2022-07-18 DIAGNOSIS — E669 Obesity, unspecified: Secondary | ICD-10-CM | POA: Insufficient documentation

## 2022-07-18 DIAGNOSIS — Z0289 Encounter for other administrative examinations: Secondary | ICD-10-CM | POA: Insufficient documentation

## 2022-07-18 DIAGNOSIS — K2980 Duodenitis without bleeding: Secondary | ICD-10-CM | POA: Insufficient documentation

## 2022-07-18 DIAGNOSIS — E236 Other disorders of pituitary gland: Secondary | ICD-10-CM | POA: Insufficient documentation

## 2022-07-18 NOTE — Progress Notes (Unsigned)
Patient: Louis Ramos  Service Category: E/M  Provider: Gaspar Cola, MD  DOB: 1966/09/11  DOS: 07/22/2022  Referring Provider: No ref. provider found  MRN: [id number]  Setting: Ambulatory outpatient  PCP: System, Provider Not In  Type: New Patient  Specialty: Interventional Pain Management    Location: Office  Delivery: Face-to-face     Primary Reason(s) for Visit: Encounter for initial evaluation of one or more chronic problems (new to examiner) potentially causing chronic pain, and posing a threat to normal musculoskeletal function. (Level of risk: High) CC: No chief complaint on file.  HPI  Louis Ramos is a 56 y.o. year old, male patient, who comes for the first time to our practice referred by No ref. provider found for our initial evaluation of his chronic pain. He has Depression; Erectile dysfunction; Knee pain; Morbid obesity ([place]); Obstructive sleep apnea of adult; Abnormal results of liver function studies; Blood in urine; Encounter for other administrative examinations; Encounter for immunization; Other specified counseling; Counseling, unspecified; General medical examination for administrative purposes; Dependence on continuous positive airway pressure ventilation; Duodenitis; Dysphagia, unspecified; Dysphagia, pharyngoesophageal phase; Effusion of lower leg joint; Fatty (change of) liver, not elsewhere classified; Fatty liver; Hypertensive disorder; Hypothyroidism; Hypothyroidism, unspecified; Knee joint replaced by other means; Low back pain; Neurogenic claudication; Major depressive disorder, recurrent, unspecified ([place]); Metabolic syndrome X; Myofascial pain; Obesity, unspecified; Obesity; Obstructive sleep apnea (adult) (pediatric); Osteoarthritis of knee; Other and unspecified hyperlipidemia; Other anterior pituitary disorders; Other malaise and fatigue; Other specified health status; Polyp of colon; Rotator cuff impingement syndrome; Spinal instabilities, lumbosacral region;  Spinal stenosis, lumbar region with neurogenic claudication; Tinea pedis; Toxic diffuse goiter; Type 2 diabetes mellitus without complications ([place]); Type 2 or unspecified type diabetes mellitus; Chronic pain syndrome; Pharmacologic therapy; Disorder of skeletal system; and Problems influencing health status on their problem list. Today he comes in for evaluation of his No chief complaint on file.  Pain Assessment: Location:     Radiating:   Onset:   Duration:   Quality:   Severity:  /10 (subjective, self-reported pain score)  Effect on ADL:   Timing:   Modifying factors:   BP:    HR:    Onset and Duration: {Hx; Onset and Duration:210120511} Cause of pain: {Hx; Cause:210120521} Severity: {Pain Severity:210120502} Timing: {Symptoms; Timing:210120501} Aggravating Factors: {Causes; Aggravating pain factors:210120507} Alleviating Factors: {Causes; Alleviating Factors:210120500} Associated Problems: {Hx; Associated problems:210120515} Quality of Pain: {Hx; Symptom quality or Descriptor:210120531} Previous Examinations or Tests: {Hx; Previous examinations or test:210120529} Previous Treatments: {Hx; Previous Treatment:210120503}  ***  Louis Ramos was informed that I continue to offer evaluations and recommendations for medication management but I no longer take patients to write for their medications. I informed him that this visit is an evaluation only and that on the follow up appointment I will go over the my review of the case, the results of available tests, and assuming that there are no contraindications, we will provide him with information about possible interventional pain management options. At that time he will have the opportunity to decide whether or not to proceed with those therapies. In the event that Louis Ramos decides not to go with those options, or prefers to stay away from interventional therapies, this will conclude our involvement in the case.   Historic Controlled  Substance Pharmacotherapy Review  PMP and historical list of controlled substances: ***  Current opioid analgesics:   *** MME/day: *** mg/day  Historical Monitoring: The patient  reports no history of drug use. List  of prior UDS Testing: No results found for: "MDMA", "COCAINSCRNUR", "PCPSCRNUR", "PCPQUANT", "CANNABQUANT", "THCU", "ETH", "CBDTHCR", "D8THCCBX", "D9THCCBX" Historical Background Evaluation: [place] PMP: PDMP reviewed during this encounter. Review of the past 35-month conducted.             PMP NARX Score Report:  Narcotic: 451 Sedative: 180 Stimulant: 000 [place] Department of public safety, offender search: (Editor, commissioningInformation) Non-contributory Risk Assessment Profile: Aberrant behavior: None observed or detected today Risk factors for fatal opioid overdose: None identified today PMP NARX Overdose Risk Score: 400 Fatal overdose hazard ratio (HR): Calculation deferred Non-fatal overdose hazard ratio (HR): Calculation deferred Risk of opioid abuse or dependence: 0.7-3.0% with doses ? 36 MME/day and 6.1-26% with doses ? 120 MME/day. Substance use disorder (SUD) risk level: See below Personal History of Substance Abuse (SUD-Substance use disorder):  Alcohol:    Illegal Drugs:    Rx Drugs:    ORT Risk Level calculation:    ORT Scoring interpretation table:  Score <3 = Low Risk for SUD  Score between 4-7 = Moderate Risk for SUD  Score >8 = High Risk for Opioid Abuse   PHQ-2 Depression Scale:  Total score:    PHQ-2 Scoring interpretation table: (Score and probability of major depressive disorder)  Score 0 = No depression  Score 1 = 15.4% Probability  Score 2 = 21.1% Probability  Score 3 = 38.4% Probability  Score 4 = 45.5% Probability  Score 5 = 56.4% Probability  Score 6 = 78.6% Probability   PHQ-9 Depression Scale:  Total score:    PHQ-9 Scoring interpretation table:  Score 0-4 = No depression  Score 5-9 = Mild depression  Score 10-14 = Moderate depression  Score  15-19 = Moderately severe depression  Score 20-27 = Severe depression (2.4 times higher risk of SUD and 2.89 times higher risk of overuse)   Pharmacologic Plan: As per protocol, I have not taken over any controlled substance management, pending the results of ordered tests and/or consults.            Initial impression: Pending review of available data and ordered tests.  Meds   Current Outpatient Medications:    carvedilol (COREG) 12.5 MG tablet, TAKE ONE TABLET BY MOUTH TWO TIMES A DAY FOR HIGH BLOOD PRESSURE, Disp: , Rfl:    Cholecalciferol 25 MCG (1000 UT) tablet, TAKE THREE TABLETS BY MOUTH EVERY DAY FOR LOW VITAMIN D, Disp: , Rfl:    fluticasone (FLONASE) 50 MCG/ACT nasal spray, INSTILL 2 SPRAYS INTO EACH NOSTRIL ONCE EVERY DAY MAXIMUM 2 SPRAYS IN EACH NOSTRIL DAILY. FOR ALLERGIES, Disp: , Rfl:    levothyroxine (SYNTHROID) 112 MCG tablet, TAKE TWO TABLETS BY MOUTH EVERY DAY FOR HYPOTHYROIDISM FOR THYROID--TAKE ON AN EMPTY STOMACH FIRST THING IN THE MORNING, AT LEAST AN HOUR BEFORE FOOD OR OTHER MEDS PLEASE NOTE CHANGE IN DOSE, Disp: , Rfl:    loratadine (CLARITIN) 10 MG tablet, Take 1 tablet by mouth daily., Disp: , Rfl:    Lubricants (LUBRICATING JELLY EX), APPLY SMALL AMOUNT TOPICALLY EVERY 96 HOURS (EVERY 4 DAYS) AS NEEDED USE WITH MUSE, Disp: , Rfl:    metFORMIN (GLUCOPHAGE-XR) 500 MG 24 hr tablet, TAKE TWO TABLETS BY MOUTH TWICE DAILY WITH MORNING AND EVENING MEALS FOR DIABETES, Disp: , Rfl:    methadone (DOLOPHINE) 10 MG tablet, TAKE ONE TABLET BY MOUTH FOUR TIMES A DAY FOR CHRONIC PAIN. AVOID TAKING WITH HYDROXYZINE., Disp: , Rfl:    Polyethylene Glycol 3350 POWD, MIX 17GM (1 CAPFUL) BY MOUTH  EVERY DAY MIX 17 GRAMS (USE BOTTLE CAP) IN LIQUID OF CHOICE AS DIRECTED, Disp: , Rfl:    Psyllium 100 % POWD, MIX 2 TABLESPOONFULS BY MOUTH EVERY DAY MIX IN JUICE OR WATER AND DRINK  START WITH 1/2 TABLESPOON THEN GO UP BY 1/2 TABLESPOON EACH WEEK UNTIL  YOU REACH 2 TABLESPOONS/DAY. MIX IN JUICE  OR WATER AND DRINK  START WITH 1/2 TABLESPOON THEN GO UP BY 1/2 TABLESPOON EACH WEEK UNTIL   YOU REACH 2 TABLESPOONS/DAY., Disp: , Rfl:    rosuvastatin (CRESTOR) 20 MG tablet, TAKE ONE-HALF TABLET BY MOUTH AT BEDTIME FOR CHOLESTEROL, Disp: , Rfl:    Semaglutide-Weight Management 2.4 MG/0.75ML SOAJ, INJECT 2.4MG UNDER SKIN ONCE WEEKLY FOR WEIGHT LOSS MANAGEMENT, Disp: , Rfl:    venlafaxine XR (EFFEXOR-XR) 150 MG 24 hr capsule, TAKE TWO CAPSULES BY MOUTH AT BEDTIME FOR DEPRESSION, Disp: , Rfl:    acetaminophen (TYLENOL) 325 MG tablet, Take 650 mg by mouth every 6 (six) hours as needed. Pain , Disp: , Rfl:    acetaminophen-codeine (TYLENOL #3) 300-30 MG tablet, Take 1-2 tablets by mouth every 4 (four) hours as needed for moderate pain., Disp: 30 tablet, Rfl: 0   ammonium lactate (AMLACTIN) 12 % lotion, Apply 1 application topically as needed for dry skin., Disp: 400 g, Rfl: 0   aspirin 325 MG tablet, Take 325 mg by mouth daily.  , Disp: , Rfl:    clotrimazole-betamethasone (LOTRISONE) cream, Apply 1 application topically 2 (two) times daily., Disp: 30 g, Rfl: 0   clotrimazole-betamethasone (LOTRISONE) cream, Apply 1 application topically 2 (two) times daily., Disp: 30 g, Rfl: 0   HYDROcodone-acetaminophen (NORCO) 10-325 MG per tablet, Take 1 tablet by mouth every 4 (four) hours as needed. Pain  (Patient not taking: Reported on 05/15/2021), Disp: , Rfl:    methimazole (TAPAZOLE) 10 MG tablet, Take 20 mg by mouth daily.   (Patient not taking: Reported on 05/15/2021), Disp: , Rfl:    methocarbamol (ROBAXIN) 750 MG tablet, Take 750 mg by mouth daily.   (Patient not taking: Reported on 05/15/2021), Disp: , Rfl:    Multiple Vitamins-Minerals (MULTIVITAMIN WITH MINERALS) tablet, Take 1 tablet by mouth daily.  , Disp: , Rfl:    oxycodone (OXY-IR) 5 MG capsule, Take 5-10 mg by mouth every 4 (four) hours as needed. Knee Pain  (Patient not taking: Reported on 05/15/2021), Disp: , Rfl:    senna (SENOKOT) 8.6 MG tablet,  Take 1 tablet by mouth daily as needed. Constipation  (Patient not taking: Reported on 05/15/2021), Disp: , Rfl:   Imaging Review  Knee Imaging: Knee-R DG 4 views: Results for orders placed during the hospital encounter of 06/04/12 DG Knee Complete 4 Views Right  Narrative *RADIOLOGY REPORT*  Clinical Data: Right knee pain.  RIGHT KNEE - COMPLETE 4+ VIEW  Comparison: None.  Findings: There is evidence of osteoarthritis with tricompartmental degenerative changes present.  The most significant joint space narrowing involves the medial compartment.  No evidence of fracture, dislocation or bony lesion.  No significant joint fluid identified.  IMPRESSION: Tricompartmental osteoarthritis.   Original Report Authenticated By: Azzie Roup, M.D.  Knee-L DG 4 views: Results for orders placed during the hospital encounter of 06/04/12 DG Knee Complete 4 Views Left  Narrative *RADIOLOGY REPORT*  Clinical Data: Knee pain with history of left knee arthroplasty.  LEFT KNEE - COMPLETE 4+ VIEW  Comparison: None.  Findings: Left total knee arthroplasty shows normal alignment.  No evidence of fracture or dislocation.  No joint effusion identified. No abnormal lucency surrounding the knee prosthesis.  IMPRESSION: Normal appearance of left knee following prior arthroplasty.   Original Report Authenticated By: Azzie Roup, M.D.  Complexity Note: Imaging results reviewed.                         ROS  Cardiovascular: {Hx; Cardiovascular History:210120525} Pulmonary or Respiratory: {Hx; Pumonary and/or Respiratory History:210120523} Neurological: {Hx; Neurological:210120504} Psychological-Psychiatric: {Hx; Psychological-Psychiatric History:210120512} Gastrointestinal: {Hx; Gastrointestinal:210120527} Genitourinary: {Hx; Genitourinary:210120506} Hematological: {Hx; Hematological:210120510} Endocrine: {Hx; Endocrine history:210120509} Rheumatologic: {Hx;  Rheumatological:210120530} Musculoskeletal: {Hx; Musculoskeletal:210120528} Work History: {Hx; Work history:210120514}  Allergies  Louis Ramos has No Known Allergies.  Laboratory Chemistry Profile   Renal Lab Results  Component Value Date   BUN 12 05/13/2011   CREATININE 1.10 05/13/2011   GFRAA >60 05/13/2011   GFRNONAA >60 05/13/2011   PROTEINUR NEGATIVE 06/05/2012     Electrolytes Lab Results  Component Value Date   NA 134 (L) 05/13/2011   K 3.9 05/13/2011   CL 97 05/13/2011   CALCIUM 10.5 05/13/2011     Hepatic No results found for: "AST", "ALT", "ALBUMIN", "ALKPHOS", "AMYLASE", "LIPASE", "AMMONIA"   ID No results found for: "LYMEIGGIGMAB", "HIV", "SARSCOV2NAA", "STAPHAUREUS", "MRSAPCR", "HCVAB", "PREGTESTUR", "RMSFIGG", "QFVRPH1IGG", "QFVRPH2IGG"   Bone No results found for: "VD25OH", "VD125OH2TOT", "VC9449QP5", "FF6384YK5", "25OHVITD1", "25OHVITD2", "25OHVITD3", "TESTOFREE", "TESTOSTERONE"   Endocrine Lab Results  Component Value Date   GLUCOSE 116 (H) 05/13/2011   GLUCOSEU NEGATIVE 06/05/2012     Neuropathy No results found for: "VITAMINB12", "FOLATE", "HGBA1C", "HIV"   CNS No results found for: "COLORCSF", "APPEARCSF", "RBCCOUNTCSF", "WBCCSF", "POLYSCSF", "LYMPHSCSF", "EOSCSF", "PROTEINCSF", "GLUCCSF", "JCVIRUS", "CSFOLI", "IGGCSF", "LABACHR", "ACETBL"   Inflammation (CRP: Acute  ESR: Chronic) No results found for: "CRP", "ESRSEDRATE", "LATICACIDVEN"   Rheumatology No results found for: "RF", "ANA", "LABURIC", "URICUR", "LYMEIGGIGMAB", "LYMEABIGMQN", "HLAB27"   Coagulation Lab Results  Component Value Date   PLT 523 (H) 05/13/2011     Cardiovascular Lab Results  Component Value Date   HGB 10.9 (L) 05/13/2011   HCT 34.1 (L) 05/13/2011     Screening No results found for: "SARSCOV2NAA", "COVIDSOURCE", "STAPHAUREUS", "MRSAPCR", "HCVAB", "HIV", "PREGTESTUR"   Cancer No results found for: "CEA", "CA125", "LABCA2"   Allergens No results found  for: "ALMOND", "APPLE", "ASPARAGUS", "AVOCADO", "BANANA", "BARLEY", "BASIL", "BAYLEAF", "GREENBEAN", "LIMABEAN", "WHITEBEAN", "BEEFIGE", "REDBEET", "BLUEBERRY", "BROCCOLI", "CABBAGE", "MELON", "CARROT", "CASEIN", "CASHEWNUT", "CAULIFLOWER", "CELERY"     Note: Lab results reviewed.  [place]  Drug: Louis Ramos  reports no history of drug use. Alcohol:  reports no history of alcohol use. Tobacco:  reports that he has never smoked. He does not have any smokeless tobacco history on file. Medical:  has a past medical history of Hypothyroid and Stomach ulcer. Family: family history is not on file.  Past Surgical History:  Procedure Laterality Date   KNEE SURGERY     SHOULDER SURGERY     Active Ambulatory Problems    Diagnosis Date Noted   Depression 07/18/2022   Erectile dysfunction 07/18/2022   Knee pain 07/18/2022   Morbid obesity ([place]) 07/18/2022   Obstructive sleep apnea of adult 07/18/2022   Abnormal results of liver function studies 07/18/2022   Blood in urine 07/18/2022   Encounter for other administrative examinations 07/18/2022   Encounter for immunization 07/18/2022   Other specified counseling 07/18/2022   Counseling, unspecified 07/18/2022   General medical examination for administrative purposes 07/18/2022   Dependence on continuous positive airway pressure ventilation 07/18/2022   Duodenitis 07/18/2022  Dysphagia, unspecified 07/18/2022   Dysphagia, pharyngoesophageal phase 07/18/2022   Effusion of lower leg joint 07/18/2022   Fatty (change of) liver, not elsewhere classified 07/18/2022   Fatty liver 07/18/2022   Hypertensive disorder 07/18/2022   Hypothyroidism 07/18/2022   Hypothyroidism, unspecified 07/18/2022   Knee joint 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) 50/38/8828   Metabolic syndrome X 00/34/9179   Myofascial pain 07/18/2022   Obesity,  unspecified 07/18/2022   Obesity 07/18/2022   Obstructive sleep apnea (adult) (pediatric) 07/18/2022   Osteoarthritis of knee 07/18/2022   Other and unspecified hyperlipidemia 07/18/2022   Other anterior pituitary disorders 07/18/2022   Other malaise and fatigue 07/18/2022   Other specified health status 07/18/2022   Polyp of colon 07/18/2022   Rotator cuff impingement syndrome 07/18/2022   Spinal instabilities, lumbosacral region 07/18/2022   Spinal stenosis, lumbar region with neurogenic claudication 07/18/2022   Tinea pedis 07/18/2022   Toxic diffuse goiter 07/18/2022   Type 2 diabetes mellitus without complications ([place]) 15/02/6978   Type 2 or unspecified type diabetes mellitus 07/18/2022   Chronic pain syndrome 07/18/2022   Pharmacologic therapy 07/18/2022   Disorder of skeletal system 07/18/2022   Problems influencing health status 07/18/2022   Resolved Ambulatory Problems    Diagnosis Date Noted   No Resolved Ambulatory Problems   Past Medical History:  Diagnosis Date   Hypothyroid    Stomach ulcer    Constitutional Exam  General appearance: Well nourished, well developed, and well hydrated. In no apparent acute distress There were no vitals filed for this visit. BMI Assessment: Estimated body mass index is 39.33 kg/m as calculated from the following:   Height as of 06/04/12: 6' (1.829 m).   Weight as of 06/04/12: 290 lb (131.5 kg).  BMI interpretation table: BMI level Category Range association with higher incidence of chronic pain  <18 kg/m2 Underweight   18.5-24.9 kg/m2 Ideal body weight   25-29.9 kg/m2 Overweight Increased incidence by 20%  30-34.9 kg/m2 Obese (Class I) Increased incidence by 68%  35-39.9 kg/m2 Severe obesity (Class II) Increased incidence by 136%  >40 kg/m2 Extreme obesity (Class III) Increased incidence by 254%   Patient's current BMI Ideal Body weight  There is no height or weight on file to calculate BMI. Patient weight not recorded    BMI Readings from Last 4 Encounters:  06/04/12 39.33 kg/m  05/13/11 33.09 kg/m   Wt Readings from Last 4 Encounters:  06/04/12 290 lb (131.5 kg)  05/13/11 244 lb (110.7 kg)    Psych/Mental status: Alert, oriented x 3 (person, place, & time)       Eyes: PERLA Respiratory: No evidence of acute respiratory distress  Assessment  Primary Diagnosis & Pertinent Problem List: The primary encounter diagnosis was Chronic pain syndrome. Diagnoses of Pharmacologic therapy, Disorder of skeletal system, and Problems influencing health status were also pertinent to this visit.  Visit Diagnosis (New problems to examiner): 1. Chronic pain syndrome   2. Pharmacologic therapy   3. Disorder of skeletal system   4. Problems influencing health status    Plan of Care (Initial workup plan)  Note: Louis Ramos was reminded that as per protocol, today's visit has been an evaluation only. We have not taken over the patient's controlled substance management.  Problem-specific plan: No problem-specific Assessment & Plan notes found for this encounter.  Lab Orders  No laboratory test(s) ordered today  Imaging Orders  No imaging studies ordered today   Referral Orders  No referral(s) requested today   Procedure Orders    No procedure(s) ordered today   Pharmacotherapy (current): Medications ordered:  No orders of the defined types were placed in this encounter.  Medications administered during this visit: Chang A. Minton had no medications administered during this visit.   Analgesic Pharmacological management:  Opioid Analgesics: We no longer take patients for opioid medication management. If requested, Louis Ramos will be evaluated for this type of pharmacotherapy. The evaluation will assess the risks and indications of the therapy, as they apply to this particular patient. We may provide recommendations on medication, dose, schedule, and monitoring. The prescribing physician will  ultimately decide, based on his/her training and level of comfort whether to adopt any of the recommendations, including the prescribing of such medicines.  Membrane stabilizer: To be determined at a later time  Muscle relaxant: To be determined at a later time  NSAID: To be determined at a later time  Other analgesic(s): To be determined at a later time   Interventional management options: Louis Ramos was informed that there is no guarantee that he would be a candidate for interventional therapies. The decision will be based on the results of diagnostic studies, as well as Mr. Granberg's risk profile.  Procedure(s) under consideration:  Pending results of ordered studies      Interventional Therapies  Risk  Complexity Considerations:   Estimated body mass index is 39.33 kg/m as calculated from the following:   Height as of 06/04/12: 6' (1.829 m).   Weight as of 06/04/12: 290 lb (131.5 kg). WNL   Planned  Pending:   See above for possible orders   Under consideration:   Pending completion of evaluation   Completed:   None at this time   Completed by other providers:   None at this time   Therapeutic  Palliative (PRN) options:   None established      Provider-requested follow-up: No follow-ups on file.  Future Appointments  Date Time Provider [place]  07/22/2022  1:00 PM Milinda Pointer, MD [facility] None    Note by: Gaspar Cola, MD Date: 07/22/2022; Time: 10:40 AM

## 2022-07-22 ENCOUNTER — Ambulatory Visit: Payer: No Typology Code available for payment source | Attending: Pain Medicine | Admitting: Pain Medicine

## 2022-07-22 ENCOUNTER — Encounter: Payer: Self-pay | Admitting: Pain Medicine

## 2022-07-22 VITALS — BP 146/97 | HR 83 | Temp 97.3°F | Resp 18 | Ht 72.0 in | Wt 309.0 lb

## 2022-07-22 DIAGNOSIS — M51369 Other intervertebral disc degeneration, lumbar region without mention of lumbar back pain or lower extremity pain: Secondary | ICD-10-CM

## 2022-07-22 DIAGNOSIS — Z79899 Other long term (current) drug therapy: Secondary | ICD-10-CM

## 2022-07-22 DIAGNOSIS — M79604 Pain in right leg: Secondary | ICD-10-CM | POA: Insufficient documentation

## 2022-07-22 DIAGNOSIS — M545 Low back pain, unspecified: Secondary | ICD-10-CM | POA: Diagnosis not present

## 2022-07-22 DIAGNOSIS — Z6841 Body Mass Index (BMI) 40.0 and over, adult: Secondary | ICD-10-CM | POA: Insufficient documentation

## 2022-07-22 DIAGNOSIS — M5136 Other intervertebral disc degeneration, lumbar region: Secondary | ICD-10-CM | POA: Insufficient documentation

## 2022-07-22 DIAGNOSIS — G894 Chronic pain syndrome: Secondary | ICD-10-CM | POA: Diagnosis present

## 2022-07-22 DIAGNOSIS — G8929 Other chronic pain: Secondary | ICD-10-CM

## 2022-07-22 DIAGNOSIS — Z789 Other specified health status: Secondary | ICD-10-CM | POA: Diagnosis present

## 2022-07-22 DIAGNOSIS — M899 Disorder of bone, unspecified: Secondary | ICD-10-CM | POA: Diagnosis present

## 2022-07-22 DIAGNOSIS — M961 Postlaminectomy syndrome, not elsewhere classified: Secondary | ICD-10-CM | POA: Diagnosis present

## 2022-07-22 DIAGNOSIS — F1120 Opioid dependence, uncomplicated: Secondary | ICD-10-CM | POA: Diagnosis present

## 2022-07-22 DIAGNOSIS — E291 Testicular hypofunction: Secondary | ICD-10-CM

## 2022-07-22 DIAGNOSIS — Z79891 Long term (current) use of opiate analgesic: Secondary | ICD-10-CM

## 2022-07-22 DIAGNOSIS — M25511 Pain in right shoulder: Secondary | ICD-10-CM | POA: Diagnosis not present

## 2022-07-22 DIAGNOSIS — F1910 Other psychoactive substance abuse, uncomplicated: Secondary | ICD-10-CM

## 2022-07-22 DIAGNOSIS — T50905S Adverse effect of unspecified drugs, medicaments and biological substances, sequela: Secondary | ICD-10-CM

## 2022-07-22 DIAGNOSIS — M79605 Pain in left leg: Secondary | ICD-10-CM | POA: Insufficient documentation

## 2022-07-22 DIAGNOSIS — R7989 Other specified abnormal findings of blood chemistry: Secondary | ICD-10-CM

## 2022-07-22 DIAGNOSIS — F1190 Opioid use, unspecified, uncomplicated: Secondary | ICD-10-CM | POA: Diagnosis present

## 2022-07-22 NOTE — Progress Notes (Unsigned)
Safety precautions to be maintained throughout the outpatient stay will include: orient to surroundings, keep bed in low position, maintain call bell within reach at all times, provide assistance with transfer out of bed and ambulation.  

## 2022-07-23 DIAGNOSIS — F1910 Other psychoactive substance abuse, uncomplicated: Secondary | ICD-10-CM | POA: Insufficient documentation

## 2022-07-23 DIAGNOSIS — R7989 Other specified abnormal findings of blood chemistry: Secondary | ICD-10-CM | POA: Insufficient documentation

## 2022-07-23 DIAGNOSIS — F1120 Opioid dependence, uncomplicated: Secondary | ICD-10-CM | POA: Insufficient documentation

## 2022-07-23 DIAGNOSIS — F1190 Opioid use, unspecified, uncomplicated: Secondary | ICD-10-CM | POA: Insufficient documentation

## 2022-07-23 DIAGNOSIS — T50905S Adverse effect of unspecified drugs, medicaments and biological substances, sequela: Secondary | ICD-10-CM | POA: Insufficient documentation

## 2022-07-23 DIAGNOSIS — E291 Testicular hypofunction: Secondary | ICD-10-CM | POA: Insufficient documentation

## 2022-07-29 LAB — COMPLIANCE DRUG ANALYSIS, UR

## 2022-08-29 NOTE — Progress Notes (Deleted)
During the precharting review I was unable to complete it since he had his x-rays done yesterday afternoon and they have not been read.

## 2022-08-31 ENCOUNTER — Other Ambulatory Visit: Payer: Self-pay | Admitting: Pain Medicine

## 2022-08-31 ENCOUNTER — Ambulatory Visit
Admission: RE | Admit: 2022-08-31 | Discharge: 2022-08-31 | Disposition: A | Discharge status: Home / Self Care | Payer: No Typology Code available for payment source | Attending: Pain Medicine | Admitting: Pain Medicine

## 2022-08-31 ENCOUNTER — Ambulatory Visit
Admission: RE | Admit: 2022-08-31 | Discharge: 2022-08-31 | Disposition: A | Discharge status: Home / Self Care | Payer: No Typology Code available for payment source | Source: Ambulatory Visit | Attending: Pain Medicine | Admitting: Pain Medicine

## 2022-08-31 DIAGNOSIS — M545 Low back pain, unspecified: Secondary | ICD-10-CM

## 2022-08-31 DIAGNOSIS — M79605 Pain in left leg: Secondary | ICD-10-CM | POA: Insufficient documentation

## 2022-08-31 DIAGNOSIS — G8929 Other chronic pain: Secondary | ICD-10-CM

## 2022-08-31 DIAGNOSIS — M961 Postlaminectomy syndrome, not elsewhere classified: Secondary | ICD-10-CM

## 2022-08-31 DIAGNOSIS — M79604 Pain in right leg: Secondary | ICD-10-CM | POA: Insufficient documentation

## 2022-08-31 DIAGNOSIS — M532X7 Spinal instabilities, lumbosacral region: Secondary | ICD-10-CM | POA: Insufficient documentation

## 2022-08-31 DIAGNOSIS — M5136 Other intervertebral disc degeneration, lumbar region: Secondary | ICD-10-CM | POA: Insufficient documentation

## 2022-08-31 DIAGNOSIS — M25511 Pain in right shoulder: Secondary | ICD-10-CM | POA: Diagnosis present

## 2022-08-31 DIAGNOSIS — M51369 Other intervertebral disc degeneration, lumbar region without mention of lumbar back pain or lower extremity pain: Secondary | ICD-10-CM

## 2022-08-31 DIAGNOSIS — M48062 Spinal stenosis, lumbar region with neurogenic claudication: Secondary | ICD-10-CM | POA: Diagnosis present

## 2022-08-31 NOTE — Progress Notes (Signed)
Today I had to reenter all the orders for the x-rays that I ordered on 07/22/2022 since the patient waited until 08/31/2022 to have those done.

## 2022-09-02 ENCOUNTER — Ambulatory Visit: Payer: Non-veteran care | Admitting: Pain Medicine

## 2022-09-05 LAB — COMP. METABOLIC PANEL (12)
AST: 25 IU/L (ref 0–40)
Albumin/Globulin Ratio: 1 — ABNORMAL LOW (ref 1.2–2.2)
Albumin: 4 g/dL (ref 3.8–4.9)
Alkaline Phosphatase: 69 IU/L (ref 44–121)
BUN/Creatinine Ratio: 7 — ABNORMAL LOW (ref 9–20)
BUN: 8 mg/dL (ref 6–24)
Bilirubin Total: 0.4 mg/dL (ref 0.0–1.2)
Calcium: 9.5 mg/dL (ref 8.7–10.2)
Chloride: 100 mmol/L (ref 96–106)
Creatinine, Ser: 1.1 mg/dL (ref 0.76–1.27)
Globulin, Total: 4.1 g/dL (ref 1.5–4.5)
Glucose: 86 mg/dL (ref 70–99)
Potassium: 4.1 mmol/L (ref 3.5–5.2)
Sodium: 139 mmol/L (ref 134–144)
Total Protein: 8.1 g/dL (ref 6.0–8.5)
eGFR: 78 mL/min/{1.73_m2} (ref >59)

## 2022-09-05 LAB — 25-HYDROXY VITAMIN D LCMS D2+D3
25-Hydroxy, Vitamin D-2: 1.7 ng/mL
25-Hydroxy, Vitamin D-3: 11 ng/mL
25-Hydroxy, Vitamin D: 13 ng/mL — ABNORMAL LOW

## 2022-09-05 LAB — VITAMIN B12: Vitamin B-12: 412 pg/mL (ref 232–1245)

## 2022-09-05 LAB — SEDIMENTATION RATE: Sed Rate: 53 mm/hr — ABNORMAL HIGH (ref 0–30)

## 2022-09-05 LAB — MAGNESIUM: Magnesium: 2 mg/dL (ref 1.6–2.3)

## 2022-09-05 LAB — C-REACTIVE PROTEIN: CRP: 5 mg/L (ref 0–10)

## 2022-09-08 ENCOUNTER — Encounter: Payer: Self-pay | Admitting: Pain Medicine

## 2022-09-08 DIAGNOSIS — R700 Elevated erythrocyte sedimentation rate: Secondary | ICD-10-CM | POA: Insufficient documentation

## 2022-09-08 DIAGNOSIS — Z96659 Presence of unspecified artificial knee joint: Secondary | ICD-10-CM | POA: Insufficient documentation

## 2022-09-08 DIAGNOSIS — E785 Hyperlipidemia, unspecified: Secondary | ICD-10-CM | POA: Insufficient documentation

## 2022-09-08 DIAGNOSIS — E119 Type 2 diabetes mellitus without complications: Secondary | ICD-10-CM | POA: Insufficient documentation

## 2022-09-08 DIAGNOSIS — E0500 Thyrotoxicosis with diffuse goiter without thyrotoxic crisis or storm: Secondary | ICD-10-CM | POA: Insufficient documentation

## 2022-09-08 DIAGNOSIS — E230 Hypopituitarism: Secondary | ICD-10-CM | POA: Insufficient documentation

## 2022-09-08 DIAGNOSIS — E559 Vitamin D deficiency, unspecified: Secondary | ICD-10-CM | POA: Insufficient documentation

## 2022-09-08 DIAGNOSIS — M25469 Effusion, unspecified knee: Secondary | ICD-10-CM | POA: Insufficient documentation

## 2022-10-15 NOTE — Patient Instructions (Signed)
____________________________________________________________________________________________  New Patients  Welcome to [facility].   Initial Visit The first or initial visit consists of an evaluation only.   Interventional pain management.  We offer therapies other than opioid controlled substances to manage chronic pain. These include, but are not limited to, diagnostic, therapeutic, and palliative specialized injection therapies (i.e.: Epidural Steroids, Facet Blocks, etc.). We specialize in a variety of nerve blocks as well as radiofrequency treatments. We offer pain implant evaluations and trials, as well as follow up management. In addition we also provide a variety joint injections, including Viscosupplementation (AKA: Gel Therapy).  Prescription Pain Medication We provide evaluations for/of pharmacologic therapies. Recommendations will follow CDC Guidelines.  We no longer take patients for long-term medication management. We will not be taking over your pain medications.  ____________________________________________________________________________________________    ____________________________________________________________________________________________  Patient Information update  To: All of our patients.  Re: Name change.  It has been made official that our current name, "[facility]"   will soon be changed to "[facility]".   The purpose of this change is to eliminate any confusion created by the concept of our practice being a "Medication Management Pain [facility]". In the past this has led to the misconception that we treat pain primarily by the use of prescription medications.  Nothing can be farther from the truth.   Understanding PAIN MANAGEMENT: To further understand what our practice does, you  first have to understand that "Pain Management" is a subspecialty that requires additional training once a physician has completed their specialty training, which can be in either Anesthesia, Neurology, Psychiatry, or Physical Medicine and Rehabilitation (PMR). Each one of these contributes to the final approach taken by each physician to the management of their patient's pain. To be a "Pain Management Specialist" you must have first completed one of the specialty trainings below.  Anesthesiologists - trained in clinical pharmacology and interventional techniques such as nerve blockade and regional as well as central neuroanatomy. They are trained to block pain before, during, and after surgical interventions.  Neurologists - trained in the diagnosis and pharmacological treatment of complex neurological conditions, such as Multiple Sclerosis, Parkinson's, spinal cord injuries, and other systemic conditions that may be associated with symptoms that may include but are not limited to pain. They tend to rely primarily on the treatment of chronic pain using prescription medications.  Psychiatrist - trained in conditions affecting the psychosocial wellbeing of patients including but not limited to depression, anxiety, schizophrenia, personality disorders, addiction, and other substance use disorders that may be associated with chronic pain. They tend to rely primarily on the treatment of chronic pain using prescription medications.   Physical Medicine and Rehabilitation (PMR) physicians, also known as physiatrists - trained to treat a wide variety of medical conditions affecting the brain, spinal cord, nerves, bones, joints, ligaments, muscles, and tendons. Their training is primarily aimed at treating patients that have suffered injuries that have caused severe physical impairment. Their training is primarily aimed at the physical therapy and rehabilitation of those patients. They may also work alongside  orthopedic surgeons or neurosurgeons using their expertise in assisting surgical patients to recover after their surgeries.  INTERVENTIONAL PAIN MANAGEMENT is sub-subspecialty of Pain Management.  Our physicians are Board-certified in Anesthesia, Pain Management, and Interventional Pain Management.  This meaning that not only have they been trained and Board-certified in their specialty of Anesthesia, and  subspecialty of Pain Management, but they have also received further training in the sub-subspecialty of Interventional Pain Management, in order to become Board-certified as INTERVENTIONAL PAIN MANAGEMENT SPECIALIST.    Mission: Our goal is to use our skills in  [place] as alternatives to the chronic use of prescription opioid medications for the treatment of pain. To make this more clear, we have changed our name to reflect what we do and offer. We will continue to offer medication management assessment and recommendations, but we will not be taking over any patient's medication management.  ____________________________________________________________________________________________

## 2022-10-15 NOTE — Progress Notes (Signed)
NO-SHOW to follow-up pain management evaluation (10/19/2022).  (10/19/2022) precharting review.  Review of initial evaluation (07/22/2022): "According to the patient the primary area of pain is that of the lower back (Midline) (Right).  The patient describes having had a back surgery around November 01, 2021.  He denies any complications with the surgery.  According to the patient he had his surgery secondary to bilateral lower extremity pain that according to him was coming from his back.  He indicated that prior to the surgery his primary area of pain was that of the lower extremities (Bilateral) (L>R).  According to the patient he has done some physical therapy (chiropractor) and he denies any postsurgical physical therapy.  Prior to the surgery he describes having had physical therapy which consisted of 3 visits per week x1 month, then he went to 2 times per week x2 weeks.  He describes that it did not help his pain.  He indicates having had acupuncture and steroid injections which he received at the [facility] approximately every 6 months for 1-1/2 years.  He refers that at the beginning he thinks he had a series of 3 then a series of 2 which did seem to help for a short.  Time (3 to 4 weeks).  He denies any nerve conduction test and he indicates having had x-rays and an MRI before the surgery but he also thinks that he might have had some done after the surgery when he went back complaining of pain.  According to the patient immediately after the surgery the pain completely went away until April when some pain "clicked" and the pain suddenly came back.   The patient's secondary area pain is that of the shoulder (Right).  He indicates having had bilateral shoulder surgery with the right one having been done around 2017 and the left one around 2006.  He denies any complications with either one of the surgeries.  He refers having done really good after those surgeries until 1 to 2 months ago when he  began experiencing pain in the right shoulder.  He denies any pain on the left one.  He denies having had physical therapy, any joint injections or nerve blocks, or any recent x-rays.   The patient's third area pain was that of the lower extremities (Bilateral) (L>R).  He describes this pain as being an electrical-like sensation.  In the case of the left lower extremity the pain goes down to the medial aspect of his calf.  He denies any pain, numbness, or weakness in the area of the left foot.  He refers that the pain seems to be deep and when he asked to describe the pattern on how the pain runs in the leg he initially indicated that it went through the inside of the leg.  In the case of the right lower extremity again he describes this pain as being deep in the upper leg and he has a lot of problems trying to describe the way the pain runs down the leg.  He indicates this pain to stop just before the knee.  He denies any x-rays, physical therapy, nerve conduction test, joint injections, or nerve blocks of the lower extremity.  The patient however indicates having had bilateral total knee replacements with the right one having been done around 2015 and the left one around 2012.  He refers no complications with the surgeries but he refers that his left leg seems to be shorter than the right and this is  something that he noted after the surgery and he claims not to have had a before it.   Other than the above, the patient refers having generalized arthralgias which he has been told that is secondary to osteoarthritis.   Physical exam: The patient had a negative bilateral straight leg raise were he he seemed to have some decreased range of motion on the right side which appeared to be limited by guarding secondary to what he described as "tightness" of the anterior thigh muscles.  Provocative Patrick maneuver was completely negative bilaterally.  Lumbar hyperextension seem to trigger some pain in the midline of  the lower back and he also displayed decreased range of motion of the lumbar spine.  Lumbar spine hyperextension on rotation seems to be positive for right-sided lumbar facet arthralgia.  Lumbar spine lateral bending seems to be positive on the right side for low back pain and no lower extremity pain.  Inspection of his lumbar spine surgery scar shows a well-healed surgery with no evidence of redness, swelling, or any other apparent complications.   Pharmacotherapy: Interestingly, when the nurse took the report during her admission the patient indicated that he was taking methadone for the pain and that he had tried some other medications as well such as oxycodone.  However, when I asked the patient what he was taking for the pain he chose to tell me that he was not taking any medications for the pain, possibly being unaware of the fact that we checked on his PMP which revealed that he takes 37 MME/day of methadone in the form of methadone 10 mg tablets 4 times daily.  The PMP also revealed that the patient had a prescription for 120 tablets filled on 07/10/2022, 13 days ago.  Furthermore review of the PMP as far as allowed by the website reveals the patient to have been taking methadone 10 mg 4 times daily, nonstop, on a monthly basis since at least 08/10/2026, with the possibility of this going back much further.   Note: For some reason the patient chose to lie to me about his medication use.  In addition, despite the fact that the paperwork that we give to the patient prior to the appointment, which he indicated having read, clearly states that the initial visit is an evaluation only and that we do not take over any type of medications or therapies, he chose to request Tylenol No.3 with codeine from [place].  This request was denied.  Later on, when the patient was informed that we would need a urine sample for a urine drug screening test, he initially gave [place] several excuses not to provide [place] with a sample until  he was informed that should he not provide [place] with a sample today, we would be unable to complete our pharmacological evaluation for the purpose of making any type of recommendations.  He was only then that he provided [place] with that urine sample.  I will assume that he was hesitant to give a stat sample secondary to the fact that this could provide me with evidence that he was not being completely truthful to me when he said that he was not taking any pain medication.  This type of behavior is usually associated with "substance use disorder".   Medical: The patient's electronic medical record shows the patient to have erectile dysfunction for which he has been also getting Depo testosterone going back to 2017.  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  He is currently morbidly obese with a weight of 309 pounds and a BMI of 41.91 kg/m.  He has a history of obstructive apnea, type 2 diabetes, and hypertension among other things."  Review of the ordered tests indicate the patient to be having a vitamin D deficiency, decreased BUN/creatinine ratio, and decreased albumin/globulin ratio as well has an increased set rate (chronic inflammation).  In terms of the imaging, the patient waited till 08/31/2022 to then decide to have the x-rays done and of course we had to resend orders since they had already expired.  The diagnostic x-rays of the left femur show evidence of a left TKR with mild arthritis of the left hip and no acute abnormalities.  X-rays of the right femur show a right TKR with mild right hip arthritis and no acute abnormalities.  X-rays of the lumbar spine with bending views show mild multilevel spondylosis with mild to moderate degenerative disc disease at the L5-S1, mild multilevel spondylosis, mild to moderate lower lumbar facet arthropathy, and no evidence of instability.  Diagnostic x-rays of the right shoulder show moderate glenohumeral arthritis with moderate  degenerative arthritis of both the glenohumeral joint and the acromioclavicular joint with no acute fractures or dislocations.  Controlled Substance Pharmacotherapy Assessment REMS (Risk Evaluation and Mitigation Strategy)  Opioid Analgesic: No chronic opioid analgesics therapy prescribed by our practice. Methadone 10 mg tablet (# 120) (last filled on 07/10/2022) MME/day: 188 mg/day  Monitoring: [place] PMP: PDMP reviewed during this encounter. Online review of the past 3-month period previously conducted. Not applicable at this point since we have not taken over the patient's medication management yet.  List of all UDS test(s) done:  Lab Results  Component Value Date   SUMMARY Note 07/22/2022   Last UDS on record: Summary  Date Value Ref Range Status  07/22/2022 Note  Final    Comment:    ==================================================================== Compliance Drug Analysis, Ur ==================================================================== Test                             Result       Flag       Units  Drug Present and Declared for Prescription Verification   Methadone                      1991         EXPECTED   ng/mg creat   EDDP (Methadone Mtb)           >6410        EXPECTED   ng/mg creat    Sources of methadone include scheduled prescription medications.    EDDP is an expected metabolite of methadone.    Venlafaxine                    PRESENT      EXPECTED   Desmethylvenlafaxine           PRESENT      EXPECTED    Desmethylvenlafaxine is an expected metabolite of venlafaxine.  Drug Present not Declared for Prescription Verification   Acetaminophen                  PRESENT      UNEXPECTED   Dextrorphan/Levorphanol        PRESENT      UNEXPECTED    Dextrorphan is an expected metabolite of dextromethorphan, an over-    the-counter or prescription  cough suppressant. Levorphanol is a    scheduled prescription medication. Dextrorphan cannot be    distinguished from  levorphanol by the method used for analysis.  ==================================================================== Test                      Result    Flag   Units      Ref Range   Creatinine              156              mg/dL      >=55 ==================================================================== Declared Medications:  The flagging and interpretation on this report are based on the  following declared medications.  Unexpected results may arise from  inaccuracies in the declared medications.   **Note: The testing scope of this panel includes these medications:   Methadone (Dolophine)  Venlafaxine (Effexor)   **Note: The testing scope of this panel does not include the  following reported medications:   Ammonium Hydroxide (Ammonium Lactate)  Betamethasone (Lotrisone)  Carvedilol (Coreg)  Cholecalciferol  Clotrimazole (Lotrisone)  Fluticasone (Flonase)  Lactic Acid (Ammonium Lactate)  Levothyroxine (Synthroid)  Loratadine (Claritin)  Metformin (Glucophage)  Methimazole (Tapazole)  Polyethylene Glycol  Psyllium  Rosuvastatin (Crestor)  Semaglutide ==================================================================== For clinical consultation, please call [phone]. ====================================================================    Risk Assessment Profile: Aberrant behavior: See initial evaluations. None observed or detected today Comorbid factors increasing risk of overdose: See initial evaluation. No additional risks detected today Opioid risk tool (ORT):     07/22/2022    1:07 PM  Opioid Risk   Alcohol 0  Illegal Drugs 0  Rx Drugs 0  Psychological Disease 2  ADD Negative  OCD Negative  Bipolar Negative  Depression 1  Opioid Risk Tool Scoring 3  Opioid Risk Interpretation Low Risk    Laboratory Chemistry Profile   Renal Lab Results  Component Value Date   BUN 8 08/31/2022   CREATININE 1.10 08/31/2022   BCR 7 (L) 08/31/2022   GFRAA >60  05/13/2011   GFRNONAA >60 05/13/2011   PROTEINUR NEGATIVE 06/05/2012     Electrolytes Lab Results  Component Value Date   NA 139 08/31/2022   K 4.1 08/31/2022   CL 100 08/31/2022   CALCIUM 9.5 08/31/2022   MG 2.0 08/31/2022     Hepatic Lab Results  Component Value Date   AST 25 08/31/2022   ALBUMIN 4.0 08/31/2022   ALKPHOS 69 08/31/2022     ID No results found for: "LYMEIGGIGMAB", "HIV", "SARSCOV2NAA", "STAPHAUREUS", "MRSAPCR", "HCVAB", "PREGTESTUR", "RMSFIGG", "QFVRPH1IGG", "QFVRPH2IGG"   Bone Lab Results  Component Value Date   25OHVITD1 13 (L) 08/31/2022   25OHVITD2 1.7 08/31/2022   25OHVITD3 11 08/31/2022     Endocrine Lab Results  Component Value Date   GLUCOSE 86 08/31/2022   GLUCOSEU NEGATIVE 06/05/2012     Neuropathy Lab Results  Component Value Date   VITAMINB12 412 08/31/2022     CNS No results found for: "COLORCSF", "APPEARCSF", "RBCCOUNTCSF", "WBCCSF", "POLYSCSF", "LYMPHSCSF", "EOSCSF", "PROTEINCSF", "GLUCCSF", "JCVIRUS", "CSFOLI", "IGGCSF", "LABACHR", "ACETBL"   Inflammation (CRP: Acute  ESR: Chronic) Lab Results  Component Value Date   CRP 5 08/31/2022   ESRSEDRATE 53 (H) 08/31/2022     Rheumatology No results found for: "RF", "ANA", "LABURIC", "URICUR", "LYMEIGGIGMAB", "LYMEABIGMQN", "HLAB27"   Coagulation Lab Results  Component Value Date   PLT 523 (H) 05/13/2011     Cardiovascular Lab Results  Component Value Date   HGB 10.9 (L) 05/13/2011  HCT 34.1 (L) 05/13/2011     Screening No results found for: "SARSCOV2NAA", "COVIDSOURCE", "STAPHAUREUS", "MRSAPCR", "HCVAB", "HIV", "PREGTESTUR"   Cancer No results found for: "CEA", "CA125", "LABCA2"   Allergens No results found for: "ALMOND", "APPLE", "ASPARAGUS", "AVOCADO", "BANANA", "BARLEY", "BASIL", "BAYLEAF", "GREENBEAN", "LIMABEAN", "WHITEBEAN", "BEEFIGE", "REDBEET", "BLUEBERRY", "BROCCOLI", "CABBAGE", "MELON", "CARROT", "CASEIN", "CASHEWNUT", "CAULIFLOWER", "CELERY"     Note:  Lab results reviewed.  Recent Diagnostic Imaging Review  Shoulder Imaging: Shoulder-R DG: Results for orders placed during the hospital encounter of 08/31/22 DG Shoulder Right  Narrative CLINICAL DATA:  Right shoulder pain  EXAM: RIGHT SHOULDER - 2+ VIEW  COMPARISON:  None Available.  FINDINGS: No acute fracture or dislocation. Moderate degenerative arthritis of the glenohumeral joint. AC joint degenerative change.  IMPRESSION: Moderate glenohumeral arthritis.   Electronically Signed By: Placido Sou M.D. On: 09/01/2022 20:35  Lumbosacral Imaging: Lumbar DG Bending views: Results for orders placed during the hospital encounter of 08/31/22 DG Lumbar Spine Complete W/Bend  Narrative CLINICAL DATA:  Low back pain  EXAM: LUMBAR SPINE - COMPLETE WITH BENDING VIEWS  COMPARISON:  CT abdomen and pelvis 08/20/2008  FINDINGS: Segmentation: 5 lumbar-type vertebral bodies based on the available coverage. Vertebral body height is maintained. No evidence of traumatic malalignment. Slight retrolisthesis of L3. Mild-to-moderate degenerative disc disease at L5-S1. Mild multilevel spondylosis. Mild to moderate lower lumbar facet arthropathy. No evidence of instability on flexion or extension.  IMPRESSION: No acute abnormality.  Mild multilevel spondylosis.   Electronically Signed By: Placido Sou M.D. On: 09/01/2022 20:34  Knee Imaging: Knee-R DG 4 views: Results for orders placed during the hospital encounter of 06/04/12 DG Knee Complete 4 Views Right  Narrative *RADIOLOGY REPORT*  Clinical Data: Right knee pain.  RIGHT KNEE - COMPLETE 4+ VIEW  Comparison: None.  Findings: There is evidence of osteoarthritis with tricompartmental degenerative changes present.  The most significant joint space narrowing involves the medial compartment.  No evidence of fracture, dislocation or bony lesion.  No significant joint  fluid identified.  IMPRESSION: Tricompartmental osteoarthritis.   Original Report Authenticated By: Azzie Roup, M.D.  Knee-L DG 4 views: Results for orders placed during the hospital encounter of 06/04/12 DG Knee Complete 4 Views Left  Narrative *RADIOLOGY REPORT*  Clinical Data: Knee pain with history of left knee arthroplasty.  LEFT KNEE - COMPLETE 4+ VIEW  Comparison: None.  Findings: Left total knee arthroplasty shows normal alignment.  No evidence of fracture or dislocation.  No joint effusion identified. No abnormal lucency surrounding the knee prosthesis.  IMPRESSION: Normal appearance of left knee following prior arthroplasty.   Original Report Authenticated By: Azzie Roup, M.D.  Complexity Note: Imaging results reviewed.                         Meds   Current Outpatient Medications:    ammonium lactate (AMLACTIN) 12 % lotion, Apply 1 application topically as needed for dry skin., Disp: 400 g, Rfl: 0   carvedilol (COREG) 12.5 MG tablet, TAKE ONE TABLET BY MOUTH TWO TIMES A DAY FOR HIGH BLOOD PRESSURE, Disp: , Rfl:    Cholecalciferol 25 MCG (1000 UT) tablet, TAKE THREE TABLETS BY MOUTH EVERY DAY FOR LOW VITAMIN D, Disp: , Rfl:    clotrimazole-betamethasone (LOTRISONE) cream, Apply 1 application topically 2 (two) times daily., Disp: 30 g, Rfl: 0   fluticasone (FLONASE) 50 MCG/ACT nasal spray, INSTILL 2 SPRAYS INTO EACH NOSTRIL ONCE EVERY DAY MAXIMUM 2 SPRAYS IN [facility]  NOSTRIL DAILY. FOR ALLERGIES, Disp: , Rfl:    levothyroxine (SYNTHROID) 112 MCG tablet, TAKE TWO TABLETS BY MOUTH EVERY DAY FOR HYPOTHYROIDISM FOR THYROID--TAKE ON AN EMPTY STOMACH FIRST THING IN THE MORNING, AT LEAST AN HOUR BEFORE FOOD OR OTHER MEDS PLEASE NOTE CHANGE IN DOSE, Disp: , Rfl:    loratadine (CLARITIN) 10 MG tablet, Take 1 tablet by mouth daily., Disp: , Rfl:    Lubricants (LUBRICATING JELLY EX), APPLY SMALL AMOUNT TOPICALLY EVERY 96 HOURS (EVERY 4 DAYS) AS NEEDED USE WITH  MUSE, Disp: , Rfl:    metFORMIN (GLUCOPHAGE-XR) 500 MG 24 hr tablet, TAKE TWO TABLETS BY MOUTH TWICE DAILY WITH MORNING AND EVENING MEALS FOR DIABETES, Disp: , Rfl:    methadone (DOLOPHINE) 10 MG tablet, TAKE ONE TABLET BY MOUTH FOUR TIMES A DAY FOR CHRONIC PAIN. AVOID TAKING WITH HYDROXYZINE., Disp: , Rfl:    methimazole (TAPAZOLE) 10 MG tablet, Take 20 mg by mouth daily., Disp: , Rfl:    Polyethylene Glycol 3350 POWD, MIX 17GM (1 CAPFUL) BY MOUTH EVERY DAY MIX 17 GRAMS (USE BOTTLE CAP) IN LIQUID OF CHOICE AS DIRECTED, Disp: , Rfl:    Psyllium 100 % POWD, MIX 2 TABLESPOONFULS BY MOUTH EVERY DAY MIX IN JUICE OR WATER AND DRINK  START WITH 1/2 TABLESPOON THEN GO UP BY 1/2 TABLESPOON EACH WEEK UNTIL  YOU REACH 2 TABLESPOONS/DAY. MIX IN JUICE OR WATER AND DRINK  START WITH 1/2 TABLESPOON THEN GO UP BY 1/2 TABLESPOON EACH WEEK UNTIL   YOU REACH 2 TABLESPOONS/DAY., Disp: , Rfl:    rosuvastatin (CRESTOR) 20 MG tablet, TAKE ONE-HALF TABLET BY MOUTH AT BEDTIME FOR CHOLESTEROL, Disp: , Rfl:    Semaglutide-Weight Management 2.4 MG/0.75ML SOAJ, INJECT 2.4MG  UNDER SKIN ONCE WEEKLY FOR WEIGHT LOSS MANAGEMENT, Disp: , Rfl:    venlafaxine XR (EFFEXOR-XR) 150 MG 24 hr capsule, TAKE TWO CAPSULES BY MOUTH AT BEDTIME FOR DEPRESSION, Disp: , Rfl:      Interventional Therapies  Risk  Complexity Considerations:   Estimated body mass index is 41.91 kg/m as calculated from the following:   Height as of this encounter: 6' (1.829 m).   Weight as of this encounter: 309 lb (140.2 kg). WNL   Planned  Pending:   Diagnostic midline caudal ESI + diagnostic epidurogram #1  Diagnostic bilateral vs right-sided lumbar facet MBB #1  Vitamin D replacement  NSAID trial (OTC options include turmeric, and/or garlic supplements) Lumbar MRI Physical therapy trial   Under consideration:   Diagnostic bilateral vs right-sided lumbar facet MBB #1  Diagnostic midline caudal ESI #1  Diagnostic right IA shoulder (glenohumeral + AC)  inj. #1  Diagnostic right suprascapular NB #1    Completed:   None at this time   Completed by other providers:   None at this time   Therapeutic  Palliative (PRN) options:   None established      Note by: Gaspar Cola, MD Date: 10/19/2022; Time: 3:18 PM

## 2022-10-19 ENCOUNTER — Ambulatory Visit (HOSPITAL_BASED_OUTPATIENT_CLINIC_OR_DEPARTMENT_OTHER): Payer: Non-veteran care | Admitting: Pain Medicine

## 2022-10-19 DIAGNOSIS — G8929 Other chronic pain: Secondary | ICD-10-CM

## 2022-10-19 DIAGNOSIS — G894 Chronic pain syndrome: Secondary | ICD-10-CM

## 2022-10-19 DIAGNOSIS — M961 Postlaminectomy syndrome, not elsewhere classified: Secondary | ICD-10-CM

## 2022-10-19 DIAGNOSIS — M545 Low back pain, unspecified: Secondary | ICD-10-CM

## 2022-10-19 DIAGNOSIS — Z91199 Patient's noncompliance with other medical treatment and regimen due to unspecified reason: Secondary | ICD-10-CM

## 2022-10-19 DIAGNOSIS — M5136 Other intervertebral disc degeneration, lumbar region: Secondary | ICD-10-CM

## 2023-06-11 ENCOUNTER — Emergency Department (HOSPITAL_COMMUNITY): Payer: No Typology Code available for payment source

## 2023-06-11 ENCOUNTER — Other Ambulatory Visit: Payer: Self-pay

## 2023-06-11 ENCOUNTER — Emergency Department (HOSPITAL_COMMUNITY)
Admission: EM | Admit: 2023-06-11 | Discharge: 2023-06-11 | Disposition: A | Discharge status: Home / Self Care | Payer: No Typology Code available for payment source | Attending: Emergency Medicine | Admitting: Emergency Medicine

## 2023-06-11 ENCOUNTER — Encounter (HOSPITAL_COMMUNITY): Payer: Self-pay | Admitting: *Deleted

## 2023-06-11 DIAGNOSIS — J020 Streptococcal pharyngitis: Secondary | ICD-10-CM

## 2023-06-11 DIAGNOSIS — Z20822 Contact with and (suspected) exposure to covid-19: Secondary | ICD-10-CM | POA: Insufficient documentation

## 2023-06-11 DIAGNOSIS — J029 Acute pharyngitis, unspecified: Secondary | ICD-10-CM | POA: Diagnosis present

## 2023-06-11 DIAGNOSIS — E039 Hypothyroidism, unspecified: Secondary | ICD-10-CM | POA: Diagnosis not present

## 2023-06-11 DIAGNOSIS — R519 Headache, unspecified: Secondary | ICD-10-CM | POA: Insufficient documentation

## 2023-06-11 DIAGNOSIS — Z79899 Other long term (current) drug therapy: Secondary | ICD-10-CM | POA: Diagnosis not present

## 2023-06-11 LAB — CBC WITH DIFFERENTIAL/PLATELET
Abs Immature Granulocytes: 0.16 10*3/uL — ABNORMAL HIGH (ref 0.00–0.07)
Basophils Absolute: 0.1 10*3/uL (ref 0.0–0.1)
Basophils Relative: 0 %
Eosinophils Absolute: 0 10*3/uL (ref 0.0–0.5)
Eosinophils Relative: 0 %
HCT: 47 % (ref 39.0–52.0)
Hemoglobin: 15 g/dL (ref 13.0–17.0)
Immature Granulocytes: 1 %
Lymphocytes Relative: 4 %
Lymphs Abs: 0.9 10*3/uL (ref 0.7–4.0)
MCH: 28.8 pg (ref 26.0–34.0)
MCHC: 31.9 g/dL (ref 30.0–36.0)
MCV: 90.2 fL (ref 80.0–100.0)
Monocytes Absolute: 0.4 10*3/uL (ref 0.1–1.0)
Monocytes Relative: 2 %
Neutro Abs: 18.2 10*3/uL — ABNORMAL HIGH (ref 1.7–7.7)
Neutrophils Relative %: 93 %
Platelets: 220 10*3/uL (ref 150–400)
RBC: 5.21 MIL/uL (ref 4.22–5.81)
RDW: 13.1 % (ref 11.5–15.5)
WBC: 19.6 10*3/uL — ABNORMAL HIGH (ref 4.0–10.5)
nRBC: 0 % (ref 0.0–0.2)

## 2023-06-11 LAB — COMPREHENSIVE METABOLIC PANEL
ALT: 21 U/L (ref 0–44)
AST: 28 U/L (ref 15–41)
Albumin: 3.7 g/dL (ref 3.5–5.0)
Alkaline Phosphatase: 66 U/L (ref 38–126)
Anion gap: 10 (ref 5–15)
BUN: 8 mg/dL (ref 6–20)
CO2: 30 mmol/L (ref 22–32)
Calcium: 9.2 mg/dL (ref 8.9–10.3)
Chloride: 94 mmol/L — ABNORMAL LOW (ref 98–111)
Creatinine, Ser: 0.97 mg/dL (ref 0.61–1.24)
GFR, Estimated: >60 mL/min (ref >60)
Glucose, Bld: 126 mg/dL — ABNORMAL HIGH (ref 70–99)
Potassium: 3.7 mmol/L (ref 3.5–5.1)
Sodium: 134 mmol/L — ABNORMAL LOW (ref 135–145)
Total Bilirubin: 1.1 mg/dL (ref 0.3–1.2)
Total Protein: 8.6 g/dL — ABNORMAL HIGH (ref 6.5–8.1)

## 2023-06-11 LAB — LIPASE, BLOOD: Lipase: 25 U/L (ref 11–51)

## 2023-06-11 LAB — GROUP A STREP BY PCR: Group A Strep by PCR: DETECTED — AB

## 2023-06-11 LAB — SARS CORONAVIRUS 2 BY RT PCR: SARS Coronavirus 2 by RT PCR: NEGATIVE

## 2023-06-11 MED ORDER — DEXAMETHASONE SODIUM PHOSPHATE 10 MG/ML IJ SOLN
10.0000 mg | Freq: Once | INTRAMUSCULAR | Status: AC
  Administered 2023-06-11: 10 mg via INTRAVENOUS
  Filled 2023-06-11: qty 1

## 2023-06-11 MED ORDER — PENICILLIN G BENZATHINE 1200000 UNIT/2ML IM SUSY
1.2000 10*6.[IU] | PREFILLED_SYRINGE | Freq: Once | INTRAMUSCULAR | Status: AC
  Administered 2023-06-11: 1.2 10*6.[IU] via INTRAMUSCULAR
  Filled 2023-06-11: qty 2

## 2023-06-11 MED ORDER — LACTATED RINGERS IV BOLUS
1000.0000 mL | Freq: Once | INTRAVENOUS | Status: AC
  Administered 2023-06-11: 1000 mL via INTRAVENOUS

## 2023-06-11 MED ORDER — ONDANSETRON HCL 4 MG/2ML IJ SOLN
4.0000 mg | Freq: Once | INTRAMUSCULAR | Status: AC
  Administered 2023-06-11: 4 mg via INTRAVENOUS
  Filled 2023-06-11: qty 2

## 2023-06-11 MED ORDER — IOHEXOL 300 MG/ML  SOLN
100.0000 mL | Freq: Once | INTRAMUSCULAR | Status: AC | PRN
  Administered 2023-06-11: 100 mL via INTRAVENOUS

## 2023-06-11 MED ORDER — KETOROLAC TROMETHAMINE 15 MG/ML IJ SOLN
15.0000 mg | Freq: Once | INTRAMUSCULAR | Status: AC
  Administered 2023-06-11: 15 mg via INTRAVENOUS
  Filled 2023-06-11: qty 1

## 2023-06-11 MED ORDER — ACETAMINOPHEN 325 MG PO TABS
650.0000 mg | ORAL_TABLET | Freq: Once | ORAL | Status: AC
  Administered 2023-06-11: 650 mg via ORAL
  Filled 2023-06-11: qty 2

## 2023-06-11 NOTE — ED Provider Notes (Signed)
Patient has strep tonsillitis.  He states he is feeling better after treatment.  He has no airway problems.  I spoke with Dr. Jenne Pane the ENT doctor and he reviewed the CT and felt like if the patient was feeling some better that he could be treated as an outpatient.  Patient was given steroids and Bicillin.  Patient will be discharged home to follow-up with ENT next week.  He was instructed return to the emergency department over the weekend if any problems   Bethann Berkshire, MD 06/11/23 1610

## 2023-06-11 NOTE — ED Provider Notes (Signed)
[place] EMERGENCY DEPARTMENT AT [facility] Provider Note   CSN: [id number] Arrival date & time: 06/11/23  7829     History {Add pertinent medical, surgical, social history, OB history to HPI:1} Chief Complaint  Patient presents with   Sore Throat    Louis Ramos is a 57 y.o. male with PMH as listed below who presents with Pt c/o sore throat, swollen tonsils/"glands" in his neck, headache, mid abdominal pain/tightness that feels like [place] x 2 days. Denies chest pain, cough, vomiting, diarrhea. Endorses some SOB. Pt reports his sore throat is the worst pain and he cannot swallow anything. He kept his sick grandson the other day when he was out of school. Denies any urinary symptoms, diarrhea, constipation. Endorses fever 102 F at home yesterday.    Past Medical History:  Diagnosis Date   Hypothyroid    Stomach ulcer        Home Medications Prior to Admission medications   Medication Sig Start Date End Date Taking? Authorizing Provider  ammonium lactate (AMLACTIN) 12 % lotion Apply 1 application topically as needed for dry skin. 05/15/21   Candelaria Stagers, DPM  carvedilol (COREG) 12.5 MG tablet TAKE ONE TABLET BY MOUTH TWO TIMES A DAY FOR HIGH BLOOD PRESSURE 12/17/21   [provider]  Cholecalciferol 25 MCG (1000 UT) tablet TAKE THREE TABLETS BY MOUTH EVERY DAY FOR LOW VITAMIN D 07/14/22   [provider]  clotrimazole-betamethasone (LOTRISONE) cream Apply 1 application topically 2 (two) times daily. 05/15/21   Candelaria Stagers, DPM  fluticasone (FLONASE) 50 MCG/ACT nasal spray INSTILL 2 SPRAYS INTO EACH NOSTRIL ONCE EVERY DAY MAXIMUM 2 SPRAYS IN EACH NOSTRIL DAILY. FOR ALLERGIES 05/11/22   [provider]  levothyroxine (SYNTHROID) 112 MCG tablet TAKE TWO TABLETS BY MOUTH EVERY DAY FOR HYPOTHYROIDISM FOR THYROID--TAKE ON AN EMPTY STOMACH FIRST THING IN THE MORNING, AT LEAST AN HOUR BEFORE FOOD OR OTHER MEDS PLEASE NOTE CHANGE IN DOSE 07/14/22    [provider]  loratadine (CLARITIN) 10 MG tablet Take 1 tablet by mouth daily. 05/13/22   [provider]  Lubricants (LUBRICATING JELLY EX) APPLY SMALL AMOUNT TOPICALLY EVERY 96 HOURS (EVERY 4 DAYS) AS NEEDED USE WITH MUSE 10/17/21   [provider]  metFORMIN (GLUCOPHAGE-XR) 500 MG 24 hr tablet TAKE TWO TABLETS BY MOUTH TWICE DAILY WITH MORNING AND EVENING MEALS FOR DIABETES 10/15/21   [provider]  methadone (DOLOPHINE) 10 MG tablet TAKE ONE TABLET BY MOUTH FOUR TIMES A DAY FOR CHRONIC PAIN. AVOID TAKING WITH HYDROXYZINE. 07/10/22   [provider]  methimazole (TAPAZOLE) 10 MG tablet Take 20 mg by mouth daily.    [provider]  Polyethylene Glycol 3350 POWD MIX 17GM (1 CAPFUL) BY MOUTH EVERY DAY MIX 17 GRAMS (USE BOTTLE CAP) IN LIQUID OF CHOICE AS DIRECTED 01/22/22   [provider]  Psyllium 100 % POWD MIX 2 TABLESPOONFULS BY MOUTH EVERY DAY MIX IN JUICE OR WATER AND DRINK  START WITH 1/2 TABLESPOON THEN GO UP BY 1/2 TABLESPOON EACH WEEK UNTIL  YOU REACH 2 TABLESPOONS/DAY. MIX IN JUICE OR WATER AND DRINK  START WITH 1/2 TABLESPOON THEN GO UP BY 1/2 TABLESPOON EACH WEEK UNTIL   YOU REACH 2 TABLESPOONS/DAY. 01/22/22   [provider]  rosuvastatin (CRESTOR) 20 MG tablet TAKE ONE-HALF TABLET BY MOUTH AT BEDTIME FOR CHOLESTEROL 07/15/22   [provider]  Semaglutide-Weight Management 2.4 MG/0.75ML SOAJ INJECT 2.4MG  UNDER SKIN ONCE WEEKLY FOR WEIGHT  LOSS MANAGEMENT 07/05/22   [provider]  venlafaxine XR (EFFEXOR-XR) 150 MG 24 hr capsule TAKE TWO CAPSULES BY MOUTH AT BEDTIME FOR DEPRESSION 02/16/22   [provider]      Allergies    Patient has no known allergies.    Review of Systems   Review of Systems A 10 point review of systems was performed and is negative unless otherwise reported in HPI.  Physical Exam Updated Vital Signs BP (!) 143/85   Pulse (!) 104   Temp 99.3 F (37.4 C) (Oral)    Resp 19   Ht 6' (1.829 m)   Wt (!) 141.5 kg   SpO2 90%   BMI 42.31 kg/m  Physical Exam General: Normal appearing obese male, lying in bed.  HEENT: PERRLA, Sclera anicteric, MMM, trachea midline. Tender cervical lymphadenopathy. Symmetric palatal rise with uvula in the center. Symmetric tonsillar pillars with erythematous and swollen tonsils bilaterally. Normal voice.  Cardiology: regular tachycardic rate, no murmurs/rubs/gallops. BL radial and DP pulses equal bilaterally.  Resp: Normal respiratory rate and effort. CTAB, no wheezes, rhonchi, crackles. No stridor.  Abd: Mild diffuse abd TTP. Soft, non-distended. No rebound tenderness or guarding.  GU: Deferred. MSK: No peripheral edema or signs of trauma. Extremities without deformity or TTP. No cyanosis or clubbing. Skin: warm, dry. Neuro: A&Ox4, CNs II-XII grossly intact. MAEs. Sensation grossly intact.  Psych: Normal mood and affect.   ED Results / Procedures / Treatments   Labs (all labs ordered are listed, but only abnormal results are displayed) Labs Reviewed  GROUP A STREP BY PCR - Abnormal; Notable for the following components:      Result Value   Group A Strep by PCR DETECTED (*)    All other components within normal limits  CBC WITH DIFFERENTIAL/PLATELET - Abnormal; Notable for the following components:   WBC 19.6 (*)    Neutro Abs 18.2 (*)    Abs Immature Granulocytes 0.16 (*)    All other components within normal limits  COMPREHENSIVE METABOLIC PANEL - Abnormal; Notable for the following components:   Sodium 134 (*)    Chloride 94 (*)    Glucose, Bld 126 (*)    Total Protein 8.6 (*)    All other components within normal limits  SARS CORONAVIRUS 2 BY RT PCR  LIPASE, BLOOD    EKG None  Radiology DG Chest 2 View  Result Date: 06/11/2023 CLINICAL DATA:  Shortness of breath and cold-like symptoms. EXAM: CHEST - 2 VIEW COMPARISON:  Remote chest radiograph 05/13/2011 FINDINGS: The cardiomediastinal contours are  normal. The lungs are clear. Pulmonary vasculature is normal. No consolidation, pleural effusion, or pneumothorax. No acute osseous abnormalities are seen. IMPRESSION: No active cardiopulmonary disease. Electronically Signed   By: Narda Rutherford M.D.   On: 06/11/2023 14:49    Procedures Procedures  {Document cardiac monitor, telemetry assessment procedure when appropriate:1}  Medications Ordered in ED Medications  penicillin g benzathine (BICILLIN LA) 1200000 UNIT/2ML injection 1.2 Million Units (has no administration in time range)  ondansetron (ZOFRAN) injection 4 mg (4 mg Intravenous Given 06/11/23 1222)  dexamethasone (DECADRON) injection 10 mg (10 mg Intravenous Given 06/11/23 1223)  acetaminophen (TYLENOL) tablet 650 mg (650 mg Oral Given 06/11/23 1223)  ketorolac (TORADOL) 15 MG/ML injection 15 mg (15 mg Intravenous Given 06/11/23 1223)  lactated ringers bolus 1,000 mL (0 mLs Intravenous Stopped 06/11/23 1357)  iohexol (OMNIPAQUE) 300 MG/ML solution 100 mL (100 mLs Intravenous Contrast Given 06/11/23 1253)  lactated ringers bolus 1,000  mL (1,000 mLs Intravenous Bolus 06/11/23 1403)    ED Course/ Medical Decision Making/ A&P                          Medical Decision Making Amount and/or Complexity of Data Reviewed Labs: ordered. Decision-making details documented in ED Course. Radiology: ordered.  Risk OTC drugs. Prescription drug management.    This patient presents to the ED for concern of ***, this involves an extensive number of treatment options, and is a complaint that carries with it a high risk of complications and morbidity.  I considered the following differential and admission for this acute, potentially life threatening condition.   MDM:    Consider systemic viral illness, strep throat, covid. GAS swab is positive. Will treat with Bicillin.  Patient is tachycardic which is likely due to decreased p.o. intake and fever in the setting of known group A strep.  He does have a  leukocytosis to 19.6 which technically does make him SIRS positive, however in the setting of group A strep I have lower concern for sepsis and higher degree of concern for decreased oral intake and fever causing his vital sign changes.  Will treat with IV fluid repletion and reassess.  Lower c/f PTA given exam but consider RPA or ludwigs given fullness in neck and submandibular region. Patient's obesity/body habitus makes exam difficult to decipher between true submandibular fullness vs just tender cervical lymphadenopathy. Patient does state he feels like it is difficult to breathe d/t fullness in throat/neck. He has no stridor, hypoxia, resp distress to indicate acute airway compromise. Breathing comfortably. Will get CT soft tissue neck w/ contrast to further evaluate.   Clinical Course as of 06/11/23 1457  Fri Jun 11, 2023  1152 Group A Strep by PCR(!): DETECTED +strep throat, will treat with bicillin IM [HN]  1314 WBC(!): 19.6 +leukocytosis [HN]    Clinical Course User Index [HN] Loetta Rough, MD    Labs: I Ordered, and personally interpreted labs.  The pertinent results include: Those listed above  Imaging Studies ordered: I ordered imaging studies including CT neck soft tissue with contrast  Additional history obtained from chart review.   Cardiac Monitoring: The patient was maintained on a cardiac monitor.  I personally viewed and interpreted the cardiac monitored which showed an underlying rhythm of: Sinus tachycardia  Reevaluation: After the interventions noted above, I reevaluated the patient and found that they have :improved  Social Determinants of Health: Lives independently  Disposition:  ***  Co morbidities that complicate the patient evaluation  Past Medical History:  Diagnosis Date   Hypothyroid    Stomach ulcer      Medicines Meds ordered this encounter  Medications   ondansetron (ZOFRAN) injection 4 mg   dexamethasone (DECADRON) injection 10 mg    acetaminophen (TYLENOL) tablet 650 mg   ketorolac (TORADOL) 15 MG/ML injection 15 mg   lactated ringers bolus 1,000 mL   iohexol (OMNIPAQUE) 300 MG/ML solution 100 mL   lactated ringers bolus 1,000 mL   penicillin g benzathine (BICILLIN LA) 1200000 UNIT/2ML injection 1.2 Million Units    Order Specific Question:   Antibiotic Indication:    Answer:   Pharyngitis    I have reviewed the patients home medicines and have made adjustments as needed  Problem List / ED Course: Problem List Items Addressed This Visit   None        {Document critical care time when appropriate:1} {Document review  of labs and clinical decision tools ie heart score, Chads2Vasc2 etc:1}  {Document your independent review of radiology images, and any outside records:1} {Document your discussion with family members, caretakers, and with consultants:1} {Document social determinants of health affecting pt's care:1} {Document your decision making why or why not admission, treatments were needed:1}  This note was created using dictation software, which may contain spelling or grammatical errors.

## 2023-06-11 NOTE — Discharge Instructions (Signed)
Follow-up with Dr. Jenne Pane or one of his colleagues next week.  Call Monday for an appointment.  Make sure they know that you are in the emergency department today and that we spoke with Dr. Jenne Pane.  Also if you have any problems over the weekend you should go to [facility] emergency department in [place] for evaluation

## 2023-06-11 NOTE — ED Notes (Signed)
Patient transported to CT 

## 2023-06-11 NOTE — ED Triage Notes (Addendum)
Pt c/o sore throat, swollen tonsils, headache, mid abdominal pain, and nausea x 2 days. Denies vomiting, diarrhea. Pt reports his sore throat is the worst pain. He kept his sick grandson the other day when he was out of school.

## 2024-02-23 ENCOUNTER — Institutional Professional Consult (permissible substitution): Admitting: Plastic Surgery
